# Patient Record
Sex: Male | Born: 1965 | Hispanic: No | Marital: Married | Smoking: Never smoker
Health system: Southern US, Community
[De-identification: ages and names within clinical notes are randomized; demographics above are authoritative.]

## PROBLEM LIST (undated history)

## (undated) DIAGNOSIS — T7840XA Allergy, unspecified, initial encounter: Secondary | ICD-10-CM

## (undated) DIAGNOSIS — L509 Urticaria, unspecified: Secondary | ICD-10-CM

## (undated) DIAGNOSIS — T783XXA Angioneurotic edema, initial encounter: Secondary | ICD-10-CM

## (undated) HISTORY — DX: Allergy, unspecified, initial encounter: T78.40XA

## (undated) HISTORY — DX: Urticaria, unspecified: L50.9

## (undated) HISTORY — DX: Angioneurotic edema, initial encounter: T78.3XXA

---

## 2000-09-10 ENCOUNTER — Ambulatory Visit (HOSPITAL_COMMUNITY): Admission: RE | Admit: 2000-09-10 | Discharge: 2000-09-10 | Payer: Self-pay | Admitting: Family Medicine

## 2000-09-10 ENCOUNTER — Encounter: Payer: Self-pay | Admitting: Family Medicine

## 2000-10-17 ENCOUNTER — Ambulatory Visit (HOSPITAL_COMMUNITY): Admission: RE | Admit: 2000-10-17 | Discharge: 2000-10-17 | Payer: Self-pay | Admitting: Family Medicine

## 2006-06-20 ENCOUNTER — Ambulatory Visit: Payer: Self-pay | Admitting: Family Medicine

## 2006-06-20 LAB — CONVERTED CEMR LAB
ALT: 78 units/L — ABNORMAL HIGH (ref 0–40)
AST: 49 units/L — ABNORMAL HIGH (ref 0–37)
Albumin: 4.3 g/dL (ref 3.5–5.2)
Alkaline Phosphatase: 72 units/L (ref 39–117)
Amylase: 60 units/L (ref 27–131)
Bilirubin, Direct: 0.1 mg/dL (ref 0.0–0.3)
H Pylori IgG: NEGATIVE
Lipase: 29 units/L (ref 11.0–59.0)
Total Bilirubin: 0.6 mg/dL (ref 0.3–1.2)
Total Protein: 7.1 g/dL (ref 6.0–8.3)

## 2006-07-01 ENCOUNTER — Ambulatory Visit: Payer: Self-pay | Admitting: Family Medicine

## 2006-07-01 LAB — CONVERTED CEMR LAB
Ceruloplasmin: 27 mg/dL (ref 21–63)
HCV Ab: NEGATIVE
Hep B S Ab: NEGATIVE
Hepatitis B Surface Ag: NEGATIVE
Iron: 71 ug/dL (ref 42–165)

## 2006-07-10 ENCOUNTER — Ambulatory Visit: Payer: Self-pay | Admitting: Gastroenterology

## 2006-07-19 ENCOUNTER — Ambulatory Visit: Payer: Self-pay | Admitting: Family Medicine

## 2006-08-22 ENCOUNTER — Ambulatory Visit: Payer: Self-pay | Admitting: Family Medicine

## 2006-08-22 LAB — CONVERTED CEMR LAB
ALT: 63 units/L — ABNORMAL HIGH (ref 0–40)
AST: 40 units/L — ABNORMAL HIGH (ref 0–37)

## 2006-10-02 ENCOUNTER — Ambulatory Visit: Payer: Self-pay | Admitting: Family Medicine

## 2006-10-13 LAB — CONVERTED CEMR LAB
ALT: 44 units/L — ABNORMAL HIGH (ref 0–40)
AST: 30 units/L (ref 0–37)

## 2006-10-15 ENCOUNTER — Telehealth (INDEPENDENT_AMBULATORY_CARE_PROVIDER_SITE_OTHER): Payer: Self-pay | Admitting: *Deleted

## 2006-10-16 DIAGNOSIS — K219 Gastro-esophageal reflux disease without esophagitis: Secondary | ICD-10-CM

## 2006-10-21 ENCOUNTER — Telehealth (INDEPENDENT_AMBULATORY_CARE_PROVIDER_SITE_OTHER): Payer: Self-pay | Admitting: *Deleted

## 2010-10-06 NOTE — Assessment & Plan Note (Signed)
[facility]                        [place] OFFICE NOTE   JAYMASON, LEDESMA                    MRN:          [id number]  DATE:06/20/2006                            DOB:          Oct 24, 1965    REASON FOR VISIT:  Abdominal pain.  Mr. Groesbeck is a 45 year old male  who reports a very long history of acid reflux.  About 3 years ago he  was evaluated by a gastroenterologist in [facility].  According to  the patient, he had an endoscopy, and he was told there was slight  redness in his gastric wall, but otherwise unremarkable.  He had been  put on Protonix, which he took for a year with no significant  improvement.  Subsequently, he was put on Nexium, which improved his  acid reflux into his throat, but did not calm down the abdominal  burning, and according to the patient, it caused dryness when he took  the Nexium daily.  Over the last year he has noticed increased acid  reflux associated with regurgitation of food.  The only thing that he  does not regurgitate according to the patient is lettuce or cucumber.  Food high in acidity are significant precipitants, i.e., lemons, tomato  based products, chocolate, and alcohol.  The wife was present and  revealed that Mr. Randazzo was a heavy alcohol consumer, and this  played a large role in his symptoms.  Additionally, she reports that  over the holidays he drank heavily, and she feels that is what  precipitated his symptoms most recently.  Mr. Krapf reports that he  deals with daily acid reflux.   REVIEW OF SYSTEMS:  He denies any weight loss, fever, chills.  He has  noted 10 to 12-pound weight gain over the last couple of months, but he  revealed that he had been eating heavily during the holidays.   With regards to his abdominal pain, he describes it as burning in the  epigastrium into the right upper quadrant.  It usually occurs after  eating, although it can occur during  eating.  Symptoms have been present  for a year.   PAST MEDICAL HISTORY:  Gastroesophageal reflux.   MEDICATIONS:  Nexium.   ALLERGIES:  NO KNOWN DRUG ALLERGIES.   FAMILY HISTORY:  Father passed away with a history of type 2 diabetes  and lung cancer, i.e., a heavy smoker.  Siblings are alive and well.   SOCIAL HISTORY:  Patient is a Education administrator.  Married with 3 children.  Denies  any tobacco use.  He states that he quit 10 years ago.  Regarding  alcohol, he is down to once a week, although it can be heavy at times.   IMPRESSION:  A 45 year old male with a history of gastroesophageal  reflux disease.  Based on history and physical, peptic ulcer disease is  high on the differential complicated by alcohol consumption.  Hepatobiliary etiology needs to be ascertained.   PLAN:  1. I had a long discussion with Dr. Jennet Maduro and his wife regarding      his alcohol consumption and the effects on his body, not  just with      the symptoms he is currently presenting with.  He expressed      understanding, and states that he is decreasing his alcohol level      currently.  2. We will start patient on Zegerid 40 mg each morning 30 minutes      before breakfast x4 weeks.  3. We will obtain the follow lab work:  H. pylori, LFT, amylase,      lipase.  4. We will refer patient for an abdominal ultrasound to assess the      gallbladder and liver.  5. Reviewed diet changes, i.e., decrease alcohol consumption, avoid      spicy foods, tomato based products, chocolate, and tobacco use.  6. Patient is to follow up with me in 1 month for reassessment,      obviously sooner if his symptoms worsen in any way.  Patient      expressed understanding and had no further questions.     Leanne Chang, M.D.  Electronically Signed    LA/MedQ  DD: 06/20/2006  DT: 06/20/2006  Job #: [id number]

## 2010-10-06 NOTE — Assessment & Plan Note (Signed)
[facility]                        [place] OFFICE NOTE   Bruce Weeks, Bruce Weeks                    MRN:          [id number]  DATE:07/19/2006                            DOB:          Oct 08, 1965    REASON FOR VISIT:  Followup on gastroesophageal reflux.   The patient presents today reporting that he feels much better after  starting the Nexium.  Specifically, he also noticed significant  improvement with changing his diet.  He also has not had any alcohol in  the last 4-6 weeks and his wife states that is probably the most likely  reason his symptoms have improved drastically.  He occasionally gets  some acid reflux but that is based on the types of foods he may eat.  The Nexium has caused no side effects.   Of note, the patient was recently seen at an Urgent Care and treated for  the flu.  Nonetheless, he was started on Cipro and a cough medicine.  He reports he feels much better.   MEDICATIONS:  1. Nexium.  2. Cipro 500 mg b.i.d. for 7 days.  3. Cough medicine.   ALLERGIES:  No drug allergies.   OBJECTIVE:  Weight 191, temperature 98.2, pulse 74, blood pressure  120/80.  GENERAL:  Pleasant male in no acute distress,  questioned appropriately.  LUNGS:  Clear.  HEART:  Regular rate and rhythm.  ABDOMEN:  Soft, nondistended, no palpable mass or hepatosplenomegaly, no  rebound or guarding.   Imaging study/ultrasound was performed on July 10, 2006, which  showed no acute findings.   LABORATORY DATA:  LFTs showed an ALT and AST of 49 and 78 on June 21, 2006.  He had subsequent labs that were performed, including iron,  hepatitis panel A, B and C, alpha-1 antitrypsin and ceruloplasmin, which  were all unremarkable.   IMPRESSION:  14. A 45 year old male with gastroesophageal reflux plus gastritis      secondary to diet and alcohol consumption, responded well to Nexium      and lifestyle changes.  2. Mildly elevated AST and  ALT, most likely secondary to what was      stated above.   PLAN:  1. Advised the patient to continue Nexium 40 mg daily for next 3-4      weeks, then he can switch over to over-the-counter Prilosec.  2. Reiterated the importance of alcohol cessation and diet changes.  3. The patient to follow up in 3 weeks as scheduled to repeat AST and      ALT.  The patient and wife expressed understanding.     Leanne Chang, M.D.  Electronically Signed    LA/MedQ  DD: 07/19/2006  DT: 07/19/2006  Job #: [id number]

## 2012-11-12 ENCOUNTER — Ambulatory Visit: Payer: Self-pay | Admitting: Emergency Medicine

## 2012-11-12 ENCOUNTER — Telehealth: Payer: Self-pay | Admitting: Radiology

## 2012-11-12 ENCOUNTER — Ambulatory Visit (HOSPITAL_COMMUNITY)
Admission: RE | Admit: 2012-11-12 | Discharge: 2012-11-12 | Disposition: A | Discharge status: Home / Self Care | Payer: Self-pay | Source: Ambulatory Visit | Attending: Emergency Medicine | Admitting: Emergency Medicine

## 2012-11-12 VITALS — BP 110/70 | HR 54 | Temp 97.7°F | Resp 18 | Ht 69.0 in | Wt 189.0 lb

## 2012-11-12 DIAGNOSIS — R252 Cramp and spasm: Secondary | ICD-10-CM

## 2012-11-12 DIAGNOSIS — R109 Unspecified abdominal pain: Secondary | ICD-10-CM

## 2012-11-12 DIAGNOSIS — M549 Dorsalgia, unspecified: Secondary | ICD-10-CM

## 2012-11-12 DIAGNOSIS — M479 Spondylosis, unspecified: Secondary | ICD-10-CM | POA: Insufficient documentation

## 2012-11-12 LAB — POCT URINALYSIS DIPSTICK
Bilirubin, UA: NEGATIVE
Blood, UA: NEGATIVE
Glucose, UA: NEGATIVE
Ketones, UA: NEGATIVE
Leukocytes, UA: NEGATIVE
Nitrite, UA: NEGATIVE
Protein, UA: NEGATIVE
Spec Grav, UA: 1.02
Urobilinogen, UA: 0.2
pH, UA: 7

## 2012-11-12 LAB — POCT CBC
Granulocyte percent: 51.4 %G (ref 37–80)
HCT, POC: 46.7 % (ref 43.5–53.7)
Hemoglobin: 15.3 g/dL (ref 14.1–18.1)
Lymph, poc: 1.8 (ref 0.6–3.4)
MCH, POC: 30.1 pg (ref 27–31.2)
MCHC: 32.8 g/dL (ref 31.8–35.4)
MCV: 91.9 fL (ref 80–97)
MID (cbc): 0.4 (ref 0–0.9)
MPV: 8 fL (ref 0–99.8)
POC Granulocyte: 2.3 (ref 2–6.9)
POC LYMPH PERCENT: 40.1 %L (ref 10–50)
POC MID %: 8.5 %M (ref 0–12)
Platelet Count, POC: 254 10*3/uL (ref 142–424)
RBC: 5.08 M/uL (ref 4.69–6.13)
RDW, POC: 13.7 %
WBC: 4.5 10*3/uL — AB (ref 4.6–10.2)

## 2012-11-12 LAB — POCT UA - MICROSCOPIC ONLY
Bacteria, U Microscopic: NEGATIVE
Casts, Ur, LPF, POC: NEGATIVE
Crystals, Ur, HPF, POC: NEGATIVE
Epithelial cells, urine per micros: NEGATIVE
Mucus, UA: NEGATIVE
Yeast, UA: NEGATIVE

## 2012-11-12 MED ORDER — DICYCLOMINE HCL 20 MG PO TABS: 20.0000 mg | ORAL_TABLET | Freq: Three times a day (TID) | ORAL | Status: DC

## 2012-11-12 MED ORDER — IOHEXOL 300 MG/ML  SOLN
80.0000 mL | Freq: Once | INTRAMUSCULAR | Status: AC | PRN
  Administered 2012-11-12: 80 mL via INTRAVENOUS

## 2012-11-12 NOTE — Patient Instructions (Signed)
Don't eat before you go to [facility] for your scan. You are to go to [facility] and go to the first floor which is Radiology. You will check in there.

## 2012-11-12 NOTE — Progress Notes (Signed)
  Subjective:    Patient ID: Duke Salvia, male    DOB: 1965/12/14, 47 y.o.   MRN: [id number]  HPI Pt complains of left sided lower abd pain. His left leg also hurts. He states it has been going on a while, but it is intermittent. Denies n/v/d. No fever. Doesn't hurt when he twists and turns. Says he feels an "itching" sensation on the inside. Normal bowel movements.     Review of Systems     Objective:   Physical Exam patient is alert and cooperative he does not appear in any distress. His chest is clear. Heart regular rate no murmurs. Abdomen is flat bowel sounds are normal there is significant tenderness left lower abdomen. Genitourinary exam reveals a small left testicle there are no hernias palpable he has some sebaceous material and the left side of the scrotal sac. Rectal exam shows a normal-sized prostate which is nontender  Results for orders placed in visit on 11/12/12  POCT CBC      Result Value Range   WBC 4.5 (*) 4.6 - 10.2 K/uL   Lymph, poc 1.8  0.6 - 3.4   POC LYMPH PERCENT 40.1  10 - 50 %L   MID (cbc) 0.4  0 - 0.9   POC MID % 8.5  0 - 12 %M   POC Granulocyte 2.3  2 - 6.9   Granulocyte percent 51.4  37 - 80 %G   RBC 5.08  4.69 - 6.13 M/uL   Hemoglobin 15.3  14.1 - 18.1 g/dL   HCT, POC 62.9  52.8 - 53.7 %   MCV 91.9  80 - 97 fL   MCH, POC 30.1  27 - 31.2 pg   MCHC 32.8  31.8 - 35.4 g/dL   RDW, POC 41.3     Platelet Count, POC 254  142 - 424 K/uL   MPV 8.0  0 - 99.8 fL  POCT URINALYSIS DIPSTICK      Result Value Range   Color, UA yellow     Clarity, UA clear     Glucose, UA neg     Bilirubin, UA neg     Ketones, UA neg     Spec Grav, UA 1.020     Blood, UA neg     pH, UA 7.0     Protein, UA neg     Urobilinogen, UA 0.2     Nitrite, UA neg     Leukocytes, UA Negative    POCT UA - MICROSCOPIC ONLY      Result Value Range   WBC, Ur, HPF, POC neg     RBC, urine, microscopic neg     Bacteria, U Microscopic neg     Mucus, UA neg     Epithelial cells,  urine per micros neg     Crystals, Ur, HPF, POC neg     Casts, Ur, LPF, POC neg     Yeast, UA neg          Assessment & Plan:  Patient with significant left lower abdominal pain with no fever and no elevation in white count. We'll proceed with CT abdomen and pelvis to evaluate. This did not disclose any abnormalities. We'll treat with Bentyl 20 mg 3 times a day to return to clinic if pain persists

## 2012-11-12 NOTE — Telephone Encounter (Signed)
Left message for patient to call me back about scan, Dr Cleta Alberts watns bentyl sent in also, this is done. If patient not improving he should return to clinic.

## 2012-11-13 NOTE — Telephone Encounter (Signed)
Pt calling back regarding his CT Scan that was done yesterday. [phone]

## 2012-11-17 ENCOUNTER — Telehealth: Payer: Self-pay

## 2012-11-17 NOTE — Telephone Encounter (Signed)
Patient advised.

## 2012-11-17 NOTE — Telephone Encounter (Signed)
Patient is calling about his results [phone]

## 2013-03-18 ENCOUNTER — Ambulatory Visit: Payer: No Typology Code available for payment source | Attending: Internal Medicine

## 2013-03-18 ENCOUNTER — Ambulatory Visit: Payer: Self-pay

## 2013-05-11 ENCOUNTER — Ambulatory Visit: Payer: No Typology Code available for payment source | Attending: Internal Medicine | Admitting: Internal Medicine

## 2013-05-11 ENCOUNTER — Encounter: Payer: Self-pay | Admitting: Internal Medicine

## 2013-05-11 VITALS — BP 146/85 | HR 90 | Temp 98.3°F | Resp 17 | Wt 191.6 lb

## 2013-05-11 DIAGNOSIS — J3489 Other specified disorders of nose and nasal sinuses: Secondary | ICD-10-CM

## 2013-05-11 DIAGNOSIS — Z139 Encounter for screening, unspecified: Secondary | ICD-10-CM

## 2013-05-11 DIAGNOSIS — R0981 Nasal congestion: Secondary | ICD-10-CM

## 2013-05-11 DIAGNOSIS — Z23 Encounter for immunization: Secondary | ICD-10-CM

## 2013-05-11 DIAGNOSIS — N529 Male erectile dysfunction, unspecified: Secondary | ICD-10-CM

## 2013-05-11 DIAGNOSIS — J020 Streptococcal pharyngitis: Secondary | ICD-10-CM

## 2013-05-11 DIAGNOSIS — K219 Gastro-esophageal reflux disease without esophagitis: Secondary | ICD-10-CM

## 2013-05-11 LAB — CBC WITH DIFFERENTIAL/PLATELET
Basophils Relative: 1 % (ref 0–1)
Eosinophils Absolute: 0.2 10*3/uL (ref 0.0–0.7)
HCT: 41.2 % (ref 39.0–52.0)
Hemoglobin: 14.8 g/dL (ref 13.0–17.0)
Lymphs Abs: 1.8 10*3/uL (ref 0.7–4.0)
MCH: 29.8 pg (ref 26.0–34.0)
MCHC: 35.9 g/dL (ref 30.0–36.0)
MCV: 82.9 fL (ref 78.0–100.0)
Monocytes Absolute: 0.7 10*3/uL (ref 0.1–1.0)
Monocytes Relative: 14 % — ABNORMAL HIGH (ref 3–12)
Neutro Abs: 2.7 10*3/uL (ref 1.7–7.7)
Neutrophils Relative %: 49 % (ref 43–77)
RBC: 4.97 MIL/uL (ref 4.22–5.81)

## 2013-05-11 LAB — COMPLETE METABOLIC PANEL WITH GFR
Albumin: 4.6 g/dL (ref 3.5–5.2)
BUN: 16 mg/dL (ref 6–23)
CO2: 24 mEq/L (ref 19–32)
Calcium: 8.9 mg/dL (ref 8.4–10.5)
Chloride: 103 mEq/L (ref 96–112)
GFR, Est African American: >89 mL/min
GFR, Est Non African American: >89 mL/min
Glucose, Bld: 102 mg/dL — ABNORMAL HIGH (ref 70–99)
Potassium: 4 mEq/L (ref 3.5–5.3)
Sodium: 139 mEq/L (ref 135–145)
Total Protein: 6.9 g/dL (ref 6.0–8.3)

## 2013-05-11 LAB — LIPID PANEL: LDL Cholesterol: 105 mg/dL — ABNORMAL HIGH (ref 0–99)

## 2013-05-11 MED ORDER — FLUTICASONE PROPIONATE 50 MCG/ACT NA SUSP: 2.0000 | Freq: Every day | NASAL | Status: DC

## 2013-05-11 MED ORDER — OMEPRAZOLE 20 MG PO CPDR: 20.0000 mg | DELAYED_RELEASE_CAPSULE | Freq: Every day | ORAL | Status: DC

## 2013-05-11 NOTE — Progress Notes (Signed)
Patient here to establish care Complain of sore throat since this am with a little pain

## 2013-05-11 NOTE — Progress Notes (Signed)
Patient Demographics  Bruce Weeks, is a 47 y.o. male  UXL:[id number]  ZDG:[id number]  DOB - 08/01/1965  CC:  Chief Complaint  Patient presents with  . Sore Throat       HPI: Bruce Weeks is a 47 y.o. male here today to establish medical care. He has his chest GERD, used to take Prilosec but has not been taking it, also reported to have some nasal congestion stuffy nose sore throat minimal cough denies any fever chills chest pain or shortness of breath, today his blood pressure is elevated denies any previous history of hypertension. Patient also reported to have erectile dysfunction denies any dysuria or any penile discharge. Patient has No headache, No chest pain, No abdominal pain - No Nausea, No new weakness tingling or numbness, minimal cough Cough - no SOB.  No Known Allergies History reviewed. No pertinent past medical history. Current Outpatient Prescriptions on File Prior to Visit  Medication Sig Dispense Refill  . dicyclomine (BENTYL) 20 MG tablet Take 1 tablet (20 mg total) by mouth every 8 (eight) hours.  40 tablet  0   No current facility-administered medications on file prior to visit.   Family History  Problem Relation Age of Onset  . Diabetes Mother   . Cancer Father   . Hypertension Maternal Grandmother   . Heart disease Maternal Grandmother    History   Social History  . Marital Status: Single    Spouse Name: N/A    Number of Children: N/A  . Years of Education: N/A   Occupational History  . Not on file.   Social History Main Topics  . Smoking status: Never Smoker   . Smokeless tobacco: Not on file  . Alcohol Use: Yes  . Drug Use: No  . Sexual Activity: Not on file   Other Topics Concern  . Not on file   Social History Narrative  . No narrative on file    Review of Systems: Constitutional: Negative for fever, chills, diaphoresis, activity change, appetite change and fatigue. HENT: Negative for ear pain, nosebleeds,, facial  swelling, rhinorrhea, neck pain, neck stiffness and ear discharge. + Nasal congestion sore throat Eyes: Negative for pain, discharge, redness, itching and visual disturbance. Respiratory: Negative for cough, choking, chest tightness, shortness of breath, wheezing and stridor.  Cardiovascular: Negative for chest pain, palpitations and leg swelling. Gastrointestinal: Negative for abdominal distention. Genitourinary: Negative for dysuria, urgency, frequency, hematuria, flank pain, decreased urine volume, difficulty urinating and dyspareunia.  Musculoskeletal: Negative for back pain, joint swelling, arthralgia and gait problem. Neurological: Negative for dizziness, tremors, seizures, syncope, facial asymmetry, speech difficulty, weakness, light-headedness, numbness and headaches.  Hematological: Negative for adenopathy. Does not bruise/bleed easily. Psychiatric/Behavioral: Negative for hallucinations, behavioral problems, confusion, dysphoric mood, decreased concentration and agitation.    Objective:   Filed Vitals:   05/11/13 1656  BP: 146/85  Pulse: 90  Temp: 98.3 F (36.8 C)  Resp: 17    Physical Exam: Constitutional: Patient appears well-developed and well-nourished. No distress. HENT: Normocephalic, atraumatic, External right and left ear normal. Oropharynx is clear and moist. Nasal congestion no sinus tenderness, minimal pharyngeal erythema no exudate  Eyes: Conjunctivae and EOM are normal. PERRLA, no scleral icterus. Neck: Normal ROM. Neck supple. No JVD. No tracheal deviation. No thyromegaly. CVS: RRR, S1/S2 +, no murmurs, no gallops, no carotid bruit.  Pulmonary: Effort and breath sounds normal, no stridor, rhonchi, wheezes, rales.  Abdominal: Soft. BS +, no distension, tenderness, rebound or guarding.  Musculoskeletal: Normal range of  motion. No edema and no tenderness.  Lymphadenopathy: No lymphadenopathy noted, cervical,  Neuro: Alert. Normal reflexes, muscle tone  coordination. No cranial nerve deficit. Skin: Skin is warm and dry. No rash noted. Not diaphoretic. No erythema. No pallor. Psychiatric: Normal mood and affect. Behavior, judgment, thought content normal.  Lab Results  Component Value Date   WBC 4.5* 11/12/2012   HGB 15.3 11/12/2012   HCT 46.7 11/12/2012   MCV 91.9 11/12/2012   No results found for this basename: CREATININE, BUN, NA, K, CL, CO2    No results found for this basename: HGBA1C   Lipid Panel  No results found for this basename: chol, trig, hdl, cholhdl, vldl, ldlcalc       Assessment and plan:   1. GERD (gastroesophageal reflux disease)  - omeprazole (PRILOSEC) 20 MG capsule; Take 1 capsule (20 mg total) by mouth daily.  Dispense: 30 capsule; Refill: 3  2. sore throat  - Rapid Strep A test is negative will send  Throat culture Loney Loh), advised patient for saltwater gargles  3. Stuffy nose  - fluticasone (FLONASE) 50 MCG/ACT nasal spray; Place 2 sprays into both nostrils daily.  Dispense: 16 g; Refill: 6  4. Erectile dysfunction  - Ambulatory referral to Urology  5. Screening - CBC with Differential - COMPLETE METABOLIC PANEL WITH GFR - TSH - Lipid panel - Vit D  25 hydroxy (rtn osteoporosis monitoring)   Health Maintenance Flu shot today   Return in about 6 weeks (around 06/22/2013).       Doris Cheadle, MD

## 2013-05-11 NOTE — Patient Instructions (Signed)
Dieta reducida en sodio, 2 gramos de sodio (2 Gram Low Sodium Diet) Esta dieta restringe la cantidad de sodio a no ms de 2 gramos o 2000 miligramos (mg) por da. La cantidad de sodio se limita para ayudar a disminuir la presin arterial y es importante para la insuficiencia cardaca y los problemas renales y hepticos. Muchos alimentos contienen sodio para mejorar el sabor y algunas veces como conservantes. Por lo tanto, cuando es necesario disminuir la cantidad de sodio en la dieta, es importante saber que productos hay que buscar cuando se eligen alimentos y bebidas. Puede demorar algn tiempo acostumbrarse a realizar cambios dietarios. Aqu se incluye informacin y una gua que lo ayudarn a adaptarse a una dieta reducida en sodio.  CONSEJOS PRCTICOS  No le agregue sal a los alimentos.  Evite los comidas de preparacin rpida que generalmente son elevadas en sodio.  Elija colaciones sin sal.  Compre productos con bajo contenido de sodio, en los que en su etiqueta diga: "bajo contenido de sodio" o "sin agregado de sal".  Verifique las etiquetas de los alimentos para conocer cunto sodio hay en una porcin.  Cuando coma en un restaurante, pida que preparen su comida con menos sal, y sin nada de sal en lo posible. LECTURA DE LAS ETIQUETAS DE LOS ALIMENTOS PARA INFORMARSE ACERCA DEL SODIO QUE CONTIENEN. La seccin Informacin Nutricional es un buen lugar en el que hallar cunta cantidad de sodio contienen los alimentos. Busque productos que no contengan ms de 500-600 mg/comida y no ms de 150 mg. por porcin Recuerde que 2 g = 2000 mg. La etiqueta de los alimentos tambin puede informar de ste modo:  Sin contenido de sodio: Menos de cinco mg. por porcin.  Muy bajo contenido de sodio: 35 mg o menos por porcin.  Bajo contenido de sodio: 140 mg o menos por porcin.  Bajo en sodio: 50% menos de sodio por porcin. Por ejemplo, si un alimento generalmente contiene 300 mg de sodio se  modifica para ser considerado bajo en sodio, tendr 150 mg de sodio.  Reducido en sodio: 25% menos de sodio por porcin. Por ejemplo, si un alimento que generalmente tiene 400 mg de sodio se modifica para ser reducido en sodio, tendr 300 mg de sodio. ELECCIN DE LOS ALIMENTOS Granos  Evite: Galletitas y colaciones saladas. Algunos cereales, incluyendo cereales calientes. Panificados y mezclas para bizcochos. Arroz condimentado o mezclas para pastas.  Elija: Colaciones sin sal. Cereales con bajo contenido de sodio, avena, arroz y trigo inflado, cereal para el desayuno. Pan y bollitos tipo Ingls. Pastas. Carnes  Evite: Saladas, en lata, ahumadas, condimentadas, en escabeche incluyendo pescado y pollo. Tocino, jamn, salchichas, fiambres, panchos, anchoas.  Elija: Atn y salmn enlatado con bajo contenido de sodio. Carne, pollo o pescado frescos o congelados. Lcteos  Evite: Quesos procesados y cremosos. Queso cottage. Manteca y leche condensada. Quesos comunes.  Elija: Leche. Queso cottage con bajo contenido de sodio. Yogur. Crema. Quesos con bajo contenido de sodio. Frutas y Vegetales  Evite: Vegetales comunes en lata. Salsa de tomate y pastas en lata. Vegetales en salsas congelados. Aceitunas. Pickles. Salsas. Chucrut.  Elija: Vegetales en lata con bajo contenido de sodio. Salsa de tomate y pastas con bajo contenido de sodio. Vegetales frescos o congelados. Frutas frescas y congeladas. Condimentos  Evite: Salsas en lata y envasadas. Salsa Worcestershire. Salsa trtara. Salsa Barbecue. Salsa de soja. Salsa de carne. Ketchup. Sal de cebolla, de ajo y sal de mesa. Saborizantes y tiernizantes para carne.  Elija:   Hierbas y especias frescas y secas. Variedades de mostaza y ketchup con bajo contenido de sodio. Jugo de limn. Salsa tabasco. Rbanos picantes. EJEMPLO DE PLAN ALIMENTARIO CON 2 GRAMOS DE SODIO Desayuno / Sodio (mg)  1 taza de leche descremada / 143 mg  2 rebanadas de pan  integral tostado / 270 mg  1 cucharada de margarina en tubo saludable para el corazn / 153 mg  1 huevo duro / 139 mg  1 naranja pequea / 0 mg Almuerzo / Sodio (mg)  1 taza de zanahorias crudas / 76 mg   taza de garbanzos / 298 mg  1 taza de leche descremada / 143 mg   taza de uvas negras / 2 mg  1 pan de pita de salvado / 356 mg Cena / Sodio (mg)  1 taza de pasta de salvado / 2 mg  1 taza de jugo de tomates bajo en sodio / 73 mg  3 onzas de carne picada magra / 57 mg  1 ensalada pequea (1 taza de espicanas crudas,  taza de pepinos,  taza de pimientos amarillos) con 1 cucharada de aceite de oliva y 1 cucharada de vinagre de vino / 25 mg Colacin / Sodio (mg)  1 pote de yogur de vainilla descremado / 107 mg  3 crackers de graham / 127 mg Anlisis nutricional  Caloras: 2033  Protenas: 77 g  Hidratos de carbono: 282 g  Grasa: 72 g  Sodio: 1.971 mg Document Released: 10/29/2005 Document Revised: 07/30/2011 [employer] Patient Information 2014 [employer].  

## 2013-05-12 LAB — TSH: TSH: 0.942 u[IU]/mL (ref 0.350–4.500)

## 2013-05-12 LAB — VITAMIN D 25 HYDROXY (VIT D DEFICIENCY, FRACTURES): Vit D, 25-Hydroxy: 19 ng/mL — ABNORMAL LOW (ref 30–89)

## 2013-05-13 ENCOUNTER — Telehealth: Payer: Self-pay

## 2013-05-13 MED ORDER — VITAMIN D (ERGOCALCIFEROL) 1.25 MG (50000 UNIT) PO CAPS: 50000.0000 [IU] | ORAL_CAPSULE | ORAL | Status: DC

## 2013-05-13 NOTE — Telephone Encounter (Signed)
Used interpreter line Patient is aware of his lab results  [facility]

## 2013-05-13 NOTE — Telephone Encounter (Signed)
Message copied by Lestine Mount on Wed May 13, 2013  9:57 AM ------      Message from: Doris Cheadle      Created: Tue May 12, 2013 10:08 AM       Blood work reviewed, noticed low vitamin D, call patient advise to start ergocalciferol 50,000 units once a week for the duration of  12 weeks.      Also  noticed elevated triglycerides , advise patient for low fat diet.       ------

## 2013-06-22 ENCOUNTER — Encounter: Payer: Self-pay | Admitting: Internal Medicine

## 2013-06-22 ENCOUNTER — Ambulatory Visit: Payer: No Typology Code available for payment source | Attending: Internal Medicine | Admitting: Internal Medicine

## 2013-06-22 VITALS — BP 142/82 | HR 62 | Temp 98.6°F | Resp 14 | Ht 68.0 in | Wt 194.0 lb

## 2013-06-22 DIAGNOSIS — E559 Vitamin D deficiency, unspecified: Secondary | ICD-10-CM | POA: Insufficient documentation

## 2013-06-22 DIAGNOSIS — K219 Gastro-esophageal reflux disease without esophagitis: Secondary | ICD-10-CM

## 2013-06-22 DIAGNOSIS — L738 Other specified follicular disorders: Secondary | ICD-10-CM | POA: Insufficient documentation

## 2013-06-22 DIAGNOSIS — L678 Other hair color and hair shaft abnormalities: Secondary | ICD-10-CM | POA: Insufficient documentation

## 2013-06-22 MED ORDER — BACITRACIN-NEOMYCIN-POLYMYXIN 400-5-5000 EX OINT: 1.0000 "application " | TOPICAL_OINTMENT | Freq: Two times a day (BID) | CUTANEOUS | Status: DC

## 2013-06-22 MED ORDER — OMEPRAZOLE 20 MG PO CPDR: 40.0000 mg | DELAYED_RELEASE_CAPSULE | Freq: Every day | ORAL | Status: DC

## 2013-06-22 MED ORDER — VITAMIN D (ERGOCALCIFEROL) 1.25 MG (50000 UNIT) PO CAPS: 50000.0000 [IU] | ORAL_CAPSULE | ORAL | Status: DC

## 2013-06-22 NOTE — Progress Notes (Signed)
Patient ID: Bruce Weeks, male   DOB: 06/27/1965, 48 y.o.   MRN: [id number]   CC:  HPI: 48 year old male presents with a follicle on his right breast. Recently he started picking on it and has noticed that it has become more itchy, no surrounding redness or inflammation His leg pain is better after starting vitamin D however he did not follow the instructions and took vitamin D 50,000 units for 3 consecutive days, now requesting a refill  History of gastroesophageal reflux disease and had an endoscopy 6 years ago   No Known Allergies No past medical history on file. Current Outpatient Prescriptions on File Prior to Visit  Medication Sig Dispense Refill  . omeprazole (PRILOSEC) 20 MG capsule Take 1 capsule (20 mg total) by mouth daily.  30 capsule  3  . Vitamin D, Ergocalciferol, (DRISDOL) 50000 UNITS CAPS capsule Take 1 capsule (50,000 Units total) by mouth every 7 (seven) days.  12 capsule  0  . dicyclomine (BENTYL) 20 MG tablet Take 1 tablet (20 mg total) by mouth every 8 (eight) hours.  40 tablet  0  . fluticasone (FLONASE) 50 MCG/ACT nasal spray Place 2 sprays into both nostrils daily.  16 g  6   No current facility-administered medications on file prior to visit.   Family History  Problem Relation Age of Onset  . Diabetes Mother   . Cancer Father   . Hypertension Maternal Grandmother   . Heart disease Maternal Grandmother    History   Social History  . Marital Status: Single    Spouse Name: N/A    Number of Children: N/A  . Years of Education: N/A   Occupational History  . Not on file.   Social History Main Topics  . Smoking status: Never Smoker   . Smokeless tobacco: Not on file  . Alcohol Use: Yes  . Drug Use: No  . Sexual Activity: Not on file   Other Topics Concern  . Not on file   Social History Narrative  . No narrative on file    Review of Systems  Constitutional: As in history of present illness HENT: Negative for ear pain, nosebleeds,  congestion, facial swelling, rhinorrhea, neck pain, neck stiffness and ear discharge.   Eyes: Negative for pain, discharge, redness, itching and visual disturbance.  Respiratory: Negative for cough, choking, chest tightness, shortness of breath, wheezing and stridor.   Cardiovascular: Negative for chest pain, palpitations and leg swelling.  Gastrointestinal: Negative for abdominal distention.  Genitourinary: Negative for dysuria, urgency, frequency, hematuria, flank pain, decreased urine volume, difficulty urinating and dyspareunia.  Musculoskeletal: Negative for back pain, joint swelling, arthralgias and gait problem.  Neurological: Negative for dizziness, tremors, seizures, syncope, facial asymmetry, speech difficulty, weakness, light-headedness, numbness and headaches.  Hematological: Negative for adenopathy. Does not bruise/bleed easily.  Psychiatric/Behavioral: Negative for hallucinations, behavioral problems, confusion, dysphoric mood, decreased concentration and agitation.    Objective:   Filed Vitals:   06/22/13 1704  BP: 142/82  Pulse: 62  Temp: 98.6 F (37 C)  Resp: 14    Physical Exam  Constitutional: Appears well-developed and well-nourished. No distress.  HENT: Normocephalic. External right and left ear normal. Oropharynx is clear and moist.  Eyes: Conjunctivae and EOM are normal. PERRLA, no scleral icterus.  Neck: Normal ROM. Neck supple. No JVD. No tracheal deviation. No thyromegaly.  CVS: RRR, S1/S2 +, no murmurs, no gallops, no carotid bruit.  Pulmonary: Effort and breath sounds normal, no stridor, rhonchi, wheezes, rales.  Abdominal: Soft.  BS +,  no distension, tenderness, rebound or guarding.  Musculoskeletal: Normal range of motion. No edema and no tenderness.  Lymphadenopathy: No lymphadenopathy noted, cervical, inguinal. Neuro: Alert. Normal reflexes, muscle tone coordination. No cranial nerve deficit. Skin: Skin is warm and dry. No rash noted. Not diaphoretic.  No erythema. Follicle in the right breast  Psychiatric: Normal mood and affect. Behavior, judgment, thought content normal.   Lab Results  Component Value Date   WBC 5.4 05/11/2013   HGB 14.8 05/11/2013   HCT 41.2 05/11/2013   MCV 82.9 05/11/2013   PLT 238 05/11/2013   Lab Results  Component Value Date   CREATININE 0.82 05/11/2013   BUN 16 05/11/2013   NA 139 05/11/2013   K 4.0 05/11/2013   CL 103 05/11/2013   CO2 24 05/11/2013    No results found for this basename: HGBA1C   Lipid Panel     Component Value Date/Time   CHOL 186 05/11/2013 1747   TRIG 189* 05/11/2013 1747   HDL 43 05/11/2013 1747   CHOLHDL 4.3 05/11/2013 1747   VLDL 38 05/11/2013 1747   LDLCALC 105* 05/11/2013 1747       Assessment and plan:   Patient Active Problem List   Diagnosis Date Noted  . GERD 10/16/2006    Gastroesophageal reflux Endoscopy about 7 years ago Patient continues to drink alcohol Therefore we'll refer for repeat EGD  Folliculitis Prescribed triple antibiotic ointment Advised the patient to stop picking on it to stop the irritation X.   Vitamin D deficiency Advised to hold vitamin D for 2 more weeks and then resume  Followup in 4 months, will need repeat labs for vitamin D, renal function    The patient was given clear instructions to go to ER or return to medical center if symptoms don't improve, worsen or new problems develop. The patient verbalized understanding. The patient was told to call to get any lab results if not heard anything in the next week.

## 2013-06-22 NOTE — Progress Notes (Signed)
Pt is here for f/u for GERD. Needs a refill on the Vitamin D. Complains of tenderness, possible cyst on the Rt side of outer breast near armpit x3 years. Felt like a pimple on the breast. Has squeezed the cyst x3 months and creates pain. Pain scale of 3 today only form touch. Pt has an interpreter.

## 2013-08-31 ENCOUNTER — Encounter (HOSPITAL_COMMUNITY): Payer: Self-pay | Admitting: Emergency Medicine

## 2013-08-31 ENCOUNTER — Emergency Department (HOSPITAL_COMMUNITY)
Admission: EM | Admit: 2013-08-31 | Discharge: 2013-08-31 | Disposition: A | Discharge status: Home / Self Care | Payer: No Typology Code available for payment source | Source: Home / Self Care | Attending: Family Medicine | Admitting: Family Medicine

## 2013-08-31 DIAGNOSIS — R059 Cough, unspecified: Secondary | ICD-10-CM

## 2013-08-31 DIAGNOSIS — J309 Allergic rhinitis, unspecified: Secondary | ICD-10-CM

## 2013-08-31 DIAGNOSIS — R05 Cough: Secondary | ICD-10-CM

## 2013-08-31 MED ORDER — BENZONATATE 100 MG PO CAPS: 100.0000 mg | ORAL_CAPSULE | Freq: Three times a day (TID) | ORAL | Status: DC | PRN

## 2013-08-31 MED ORDER — CETIRIZINE HCL 10 MG PO TABS: 10.0000 mg | ORAL_TABLET | Freq: Every day | ORAL | Status: DC

## 2013-08-31 MED ORDER — IPRATROPIUM BROMIDE 0.06 % NA SOLN: 2.0000 | Freq: Four times a day (QID) | NASAL | Status: DC

## 2013-08-31 NOTE — ED Notes (Signed)
Cough x past few days , causing ST, HA

## 2013-08-31 NOTE — ED Provider Notes (Signed)
CSN: [id number]     Arrival date & time 08/31/13  1938 History   First MD Initiated Contact with Patient 08/31/13 2104     Chief Complaint  Patient presents with  . Cough   (Consider location/radiation/quality/duration/timing/severity/associated sxs/prior Treatment) Patient is a 48 y.o. male presenting with cough. The history is provided by the patient, the spouse and a relative. The history is limited by a language barrier. A language interpreter was used.  Cough Cough characteristics:  Non-productive Severity:  Mild Onset quality:  Gradual Duration:  3 days Timing:  Intermittent Chronicity:  New Smoker: no   Context: exposure to allergens and weather changes   Associated symptoms: ear fullness, rhinorrhea, sinus congestion and sore throat   Associated symptoms: no chest pain, no chills, no diaphoresis, no ear pain, no eye discharge, no fever, no headaches, no myalgias, no rash, no shortness of breath, no weight loss and no wheezing   Associated symptoms comment:  +post nasal drainage   History reviewed. No pertinent past medical history. History reviewed. No pertinent past surgical history. Family History  Problem Relation Age of Onset  . Diabetes Mother   . Cancer Father   . Hypertension Maternal Grandmother   . Heart disease Maternal Grandmother    History  Substance Use Topics  . Smoking status: Never Smoker   . Smokeless tobacco: Not on file  . Alcohol Use: Yes    Review of Systems  Constitutional: Negative for fever, chills, weight loss and diaphoresis.  HENT: Positive for rhinorrhea and sore throat. Negative for ear pain.   Eyes: Negative for discharge.  Respiratory: Positive for cough. Negative for shortness of breath and wheezing.   Cardiovascular: Negative for chest pain.  Musculoskeletal: Negative for myalgias.  Skin: Negative for rash.  Neurological: Negative for headaches.  All other systems reviewed and are negative.   Allergies  Review of patient's  allergies indicates no known allergies.  Home Medications   Current Outpatient Rx  Name  Route  Sig  Dispense  Refill  . benzonatate (TESSALON) 100 MG capsule   Oral   Take 1 capsule (100 mg total) by mouth 3 (three) times daily as needed for cough.   21 capsule   0   . cetirizine (ZYRTEC) 10 MG tablet   Oral   Take 1 tablet (10 mg total) by mouth daily.   30 tablet   1   . dicyclomine (BENTYL) 20 MG tablet   Oral   Take 1 tablet (20 mg total) by mouth every 8 (eight) hours.   40 tablet   0   . fluticasone (FLONASE) 50 MCG/ACT nasal spray   Each Nare   Place 2 sprays into both nostrils daily.   16 g   6   . ipratropium (ATROVENT) 0.06 % nasal spray   Each Nare   Place 2 sprays into both nostrils 4 (four) times daily.   15 mL   1   . neomycin-bacitracin-polymyxin (NEOSPORIN) ointment   Topical   Apply 1 application topically every 12 (twelve) hours. Apply to folliculitis on the right breast   30 g   0   . omeprazole (PRILOSEC) 20 MG capsule   Oral   Take 2 capsules (40 mg total) by mouth daily.   60 capsule   3   . Vitamin D, Ergocalciferol, (DRISDOL) 50000 UNITS CAPS capsule   Oral   Take 1 capsule (50,000 Units total) by mouth every 7 (seven) days.   6 capsule  2    BP 144/66  Pulse 88  Temp(Src) 98.3 F (36.8 C) (Oral)  Resp 16 Physical Exam  Nursing note and vitals reviewed. Constitutional: He is oriented to person, place, and time. He appears well-developed and well-nourished. No distress.  HENT:  Head: Normocephalic and atraumatic.  Right Ear: Hearing, tympanic membrane, external ear and ear canal normal.  Left Ear: Hearing, tympanic membrane, external ear and ear canal normal.  Nose: Mucosal edema and rhinorrhea present.  Mouth/Throat: Uvula is midline, oropharynx is clear and moist and mucous membranes are normal.  +clear post nasal drainage  Eyes: Conjunctivae are normal. Right eye exhibits no discharge. Left eye exhibits no discharge. No  scleral icterus.  Neck: Normal range of motion. Neck supple.  Cardiovascular: Normal rate, regular rhythm and normal heart sounds.   Pulmonary/Chest: Effort normal and breath sounds normal.  Musculoskeletal: Normal range of motion.  Lymphadenopathy:    He has no cervical adenopathy.  Neurological: He is alert and oriented to person, place, and time.  Skin: Skin is warm and dry. No rash noted.  Psychiatric: He has a normal mood and affect. His behavior is normal.    ED Course  Procedures (including critical care time) Labs Review Labs Reviewed - No data to display Imaging Review No results found.   MDM   1. Allergic rhinitis   2. Cough   Will treat with Atrovent, Zyrtec and tessalon and advise PCP follow up if symptoms do not improve.    Jess BartersJennifer Lee DundalkPresson, GeorgiaPA 08/31/13 2303

## 2013-08-31 NOTE — Discharge Instructions (Signed)
Rinitis alrgica (Allergic Rhinitis) La rinitis alrgica ocurre cuando las membranas mucosas de la nariz responden a los alrgenos. Los alrgenos son las partculas que estn en el aire y que hacen que el cuerpo tenga una reaccin IT consultant. Esto hace que usted libere anticuerpos alrgicos. A travs de una cadena de eventos, estos finalmente hacen que usted libere histamina en la corriente sangunea. Aunque la funcin de la histamina es proteger al organismo, es esta liberacin de histamina lo que provoca malestar, como los estornudos frecuentes, la congestin y goteo y Engineer, petroleum.  CAUSAS  La causa de la rinitis Regulatory affairs officer (fiebre del heno) son los alrgenos del polen que pueden provenir del csped, los rboles y Human resources officer. La causa de la rinitis Stage manager (rinitis alrgica perenne) son los alrgenos como los caros del polvo domstico, la caspa de las mascotas y las esporas del moho.  SNTOMAS   Secrecin nasal (congestin).  Goteo y picazn nasales con estornudos y Industrial/product designer. DIAGNSTICO  Su mdico puede ayudarlo a Actor alrgeno o los alrgenos que desencadenan sus sntomas. Si usted y su mdico no pueden Teacher, adult education cul es el alrgeno, pueden hacerse anlisis de sangre o estudios de la piel. TRATAMIENTO  La rinitis alrgica no tiene [place], pero puede controlarse mediante lo siguiente:  Medicamentos y vacunas contra la alergia (inmunoterapia).  Prevencin del alrgeno. La fiebre del heno a menudo puede tratarse con antihistamnicos en las formas de pldoras o aerosol nasal. Los antihistamnicos bloquean los efectos de la histamina. Existen medicamentos de venta libre que pueden ayudar con la congestin nasal y la hinchazn alrededor de los ojos. Consulte a su mdico antes de tomar o administrarse este medicamento.  Si la prevencin del alrgeno o el medicamento recetado no dan resultado, existen muchos medicamentos nuevos que su mdico puede recetarle. Pueden  usarse medicamentos ms fuertes si las medidas iniciales no son efectivas. Pueden aplicarse inyecciones desensibilizantes si los medicamentos y la prevencin no funcionan. La desensibilizacin ocurre cuando un paciente recibe vacunas constantes hasta que el cuerpo se vuelve menos sensible al alrgeno. Asegrese de Chartered certified accountant seguimiento con su mdico si los problemas continan. INSTRUCCIONES PARA EL CUIDADO EN EL HOGAR No es posible evitar por completo los alrgenos, pero puede reducir los sntomas al tomar medidas para limitar su exposicin a ellos. Es muy til saber exactamente a qu es alrgico para que pueda evitar sus desencadenantes especficos. SOLICITE ATENCIN MDICA SI:   Bruce Weeks.  Desarrolla una tos que no se detiene fcilmente (persistente).  Le falta el aire.  Comienza a tener sibilancias.  Los sntomas interfieren con las actividades diarias normales. Document Released: 02/14/2005 Document Revised: 02/25/2013 [facility] Patient Information 2014 [place], [place].  Tos - Adultos  (Cough, Adult)  La tos es un reflejo que ayuda a limpiar las vas areas y Patent examiner. Puede ayudar a curar el organismo o ser Ardelia Mems reaccin a un irritante. La tos puede durar [employer] 2  3 semanas (aguda) o puede durar ms de 8 semanas (crnica)  CAUSAS  Tos aguda:   Infecciones virales o bacterianas. Tos crnica.   Infecciones.  Alergias.  Asma.  Goteo post nasal.  El hbito de fumar.  Acidez o reflujo gstrico.  Algunos medicamentos.  Problemas pulmonares crnicos  Cncer. SNTOMAS   Tos.  Cristy Hilts.  Dolor en el pecho.  Aumento en el ritmo respiratorio.  Ruidos agudos al respirar (sibilancias).  Moco coloreado al toser (esputo). TRATAMIENTO   Un tos de causa bacteriana puede tratarse con antibiticos.  La  tos de origen viral debe seguir su curso y no responde a los antibióticos. °· El médico podrá recomendar otros tratamientos si tiene tos crónica. °INSTRUCCIONES PARA  EL CUIDADO EN EL HOGAR  °· Solo tome medicamentos que se pueden comprar sin receta o recetados para el dolor, malestar o fiebre, como le indica el médico. Utilice antitusivos sólo en la forma indicada por el médico. °· Use un vaporizador o humidificador de niebla fría en la habitación para ayudar a aflojar las secreciones. °· Duerma en posición semi erguida si la tos empeora por la noche. °· Descanse todo lo que pueda. °· Si fuma, abandone el hábito. °SOLICITE ATENCIÓN MÉDICA DE INMEDIATO SI:  °· Observa pus en el esputo. °· La tos empeora. °· No puede controlar la tos con antitusivos y no puede dormir debido a ello. °· Comienza a escupir sangre al toser. °· Tiene dificultad para respirar. °· El dolor empeora o no puede controlarlo con los medicamentos. °· Tiene fiebre. °ASEGÚRESE DE QUE:  °· Comprende estas instrucciones. °· Controlará su enfermedad. °· Solicitará ayuda de inmediato si no mejora o si empeora. °Document Released: 12/13/2010 Document Revised: 07/30/2011 °ExitCare® Patient Information ©2014 [employer]. ° °

## 2013-09-01 NOTE — ED Provider Notes (Signed)
Medical screening examination/treatment/procedure(s) were performed by resident physician or non-physician practitioner and as supervising physician I was immediately available for consultation/collaboration.   KINDL,JAMES DOUGLAS MD.   James D Kindl, MD 09/01/13 1117 

## 2013-09-04 ENCOUNTER — Ambulatory Visit: Payer: Self-pay | Admitting: Family Medicine

## 2013-09-04 VITALS — BP 118/72 | HR 77 | Temp 98.8°F | Resp 16 | Ht 69.0 in | Wt 191.8 lb

## 2013-09-04 DIAGNOSIS — J209 Acute bronchitis, unspecified: Secondary | ICD-10-CM

## 2013-09-04 MED ORDER — HYDROCODONE-HOMATROPINE 5-1.5 MG/5ML PO SYRP: 5.0000 mL | ORAL_SOLUTION | Freq: Three times a day (TID) | ORAL | Status: DC | PRN

## 2013-09-04 MED ORDER — AZITHROMYCIN 250 MG PO TABS: ORAL_TABLET | ORAL | Status: DC

## 2013-09-04 NOTE — Progress Notes (Addendum)
He reports x1 week of constant, gradually worsened cough, fever, congestion and HA. He was seen in the ED for this problem x4 days ago  and rx w/ atrovent, zyrtec and tessalon; was told to f/u w/his PCP is sx did not improve. He states he cannot tell that his medications have been improving his sx which is why he came here today. He reports same sx as when he was seen at the ED which have since worsened.   Objective: No acute distress, mildly diaphoretic, cooperative and alert HEENT: Unremarkable except for mild erythema in the throat without exudates Neck: Supple no adenopathy Chest: Few rhonchi, no rales Heart: Regular no murmur  Assessment: Bronchitis  Acute bronchitis - Plan: HYDROcodone-homatropine (HYCODAN) 5-1.5 MG/5ML syrup, azithromycin (ZITHROMAX Z-PAK) 250 MG tablet, DISCONTINUED: azithromycin (ZITHROMAX Z-PAK) 250 MG tablet  Signed, Elvina SidleKurt Jenica Costilow, MD

## 2013-09-04 NOTE — Patient Instructions (Signed)
Bronquitis  (Bronchitis)  La bronquitis es una inflamación de las vías respiratorias que se extienden desde la tráquea hasta los pulmones (bronquios). A menudo, la inflamación produce la formación de mucosidad, lo que genera tos. Si la inflamación es grave, puede provocar falta de aire.  CAUSAS   Las causas de la bronquitis pueden ser:   · Infecciones virales.  · Bacterias.  · Humo del cigarrillo.  · Alérgenos, contaminantes y otros irritantes.  SIGNOS Y SÍNTOMAS   El síntoma más habitual de la bronquitis es la tos frecuente con mucosidad. Otros síntomas son:  · Fiebre.  · Dolores en el cuerpo.  · Congestión en el pecho.  · Escalofríos.  · Falta de aire.  · Dolor de garganta.  DIAGNÓSTICO   La bronquitis en general se diagnostica con la historia clínica y un examen físico. En algunos casos se indican otros estudios, como radiografías, para descartar otras enfermedades.   TRATAMIENTO   Tal vez deba evitar el contacto con la causa del problema (por ejemplo, el cigarrillo). En algunos casos es necesario administrar medicamentos. Estos pueden ser:  · Antibióticos. Tal vez se los receten si la causa de la bronquitis es una bacteria.  · Antitusivos. Tal vez se los receten para aliviar los síntomas de la tos.  · Medicamentos inhalados. Tal vez se los receten para liberar las vías respiratorias y facilitar la respiración.  · Medicamentos con corticoides. Tal vez se los receten si tiene bronquitis recurrente (crónica).  INSTRUCCIONES PARA EL CUIDADO EN EL HOGAR  · Descanse lo suficiente.  · Beba líquidos en abundancia para mantener la orina de color claro o amarillo pálido (excepto que padezca una enfermedad que requiera la restricción de líquidos). Tome mucho líquido para disolver las secreciones y evitar la deshidratación.  · Tome solo medicamentos de venta libre o recetados, según las indicaciones del médico.  · Tome los antibióticos exclusivamente según las indicaciones. Finalice la prescripción completa, aunque se  sienta mejor.  · Evite el humo de segunda mano, los químicos irritantes y los vapores fuertes. Estos agentes empeoran la bronquitis. Si es fumador, abandone el hábito. Considere el uso de goma de mascar o la aplicación de parches en la piel que contengan nicotina para aliviar los síntomas de abstinencia. Si deja de fumar, sus pulmones se curarán más rápido.  · Ponga un humidificador de vapor frío en la habitación por la noche para humedecer el aire. Puede ayudarlo a aflojar la mucosidad. Cambie el agua del humidificador a diario. También puede abrir el agua caliente de la ducha y sentarse en el baño con la puerta cerrada durante 5 a 10 minutos.  · Concurra a las consultas de control con su médico según las indicaciones.  · Lávese las manos con frecuencia para evitar contagiarse bronquitis nuevamente y para no extender la infección a otras personas.  SOLICITE ATENCIÓN MÉDICA SI:  Los síntomas no mejoran después de 1 semana de tratamiento.   SOLICITE ATENCIÓN MÉDICA DE INMEDIATO SI:  · La fiebre aumenta.  · Tiene escalofríos.  · Siente dolor en el pecho.  · Le empeora la falta el aire.  · La flema tiene sangre.  · Se desmaya.  · Tiene vahídos.  · Sufre un dolor intenso de cabeza.  · Vomita repetidas veces.  ASEGÚRESE DE QUE:   · Comprende estas instrucciones.  · Controlará su afección.  · Recibirá ayuda de inmediato si no mejora o si empeora.  Document Released: 05/07/2005 Document Revised: 02/25/2013  ExitCare® Patient Information ©2014 [employer].

## 2013-09-08 ENCOUNTER — Encounter: Payer: Self-pay | Admitting: Internal Medicine

## 2013-10-01 IMAGING — CT CT ABD-PELV W/ CM
2 of 5 series · 14 of 32 positions shown, 19 images · IV contrast (water/omni  & 80ml omni 300)
Comparison: 07/10/2006 abdominal ultrasound

CLINICAL DATA: Left abdominal pain

CT ABDOMEN AND PELVIS WITH CONTRAST
TECHNIQUE: Multidetector CT imaging of the abdomen and pelvis was
performed following the standard protocol during bolus
administration of intravenous contrast.
Contrast: 80mL OMNIPAQUE IOHEXOL 300 MG/ML  SOLN

[Series 2: routine abdomen · axial · 0.77mm/px · z∈[-490,-160]mm · 7 of 90 slices shown, 12 images]
[im 12/90  soft-tissue]
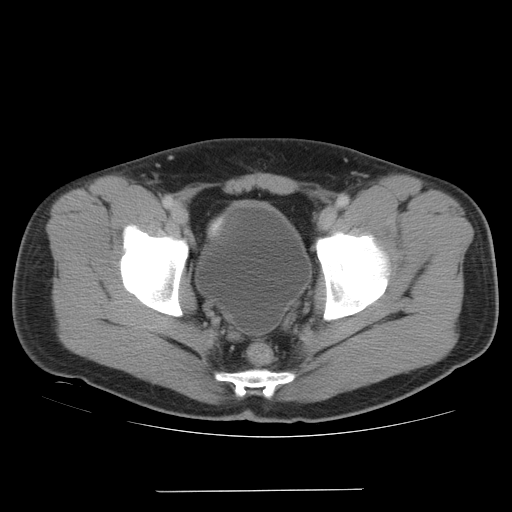
[im 12/90  bone]
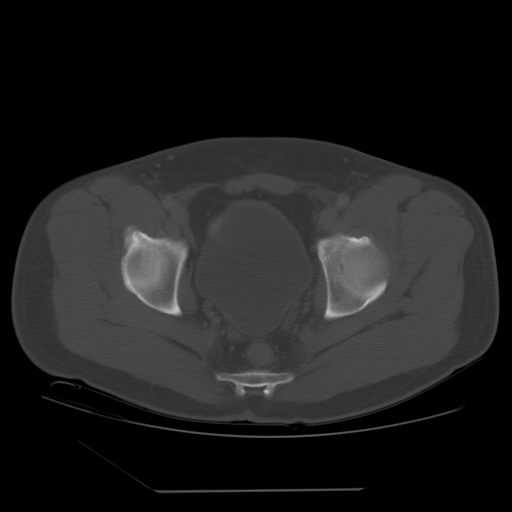
[im 23/90  soft-tissue]
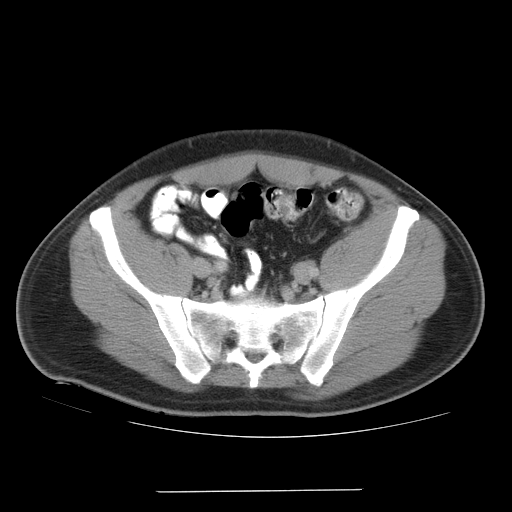
[im 34/90  soft-tissue]
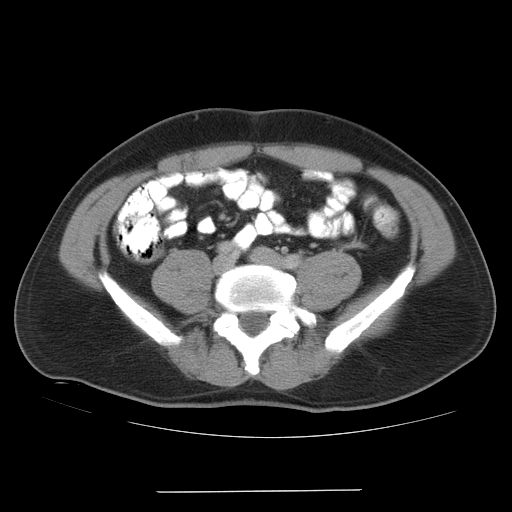
[im 45/90  soft-tissue]
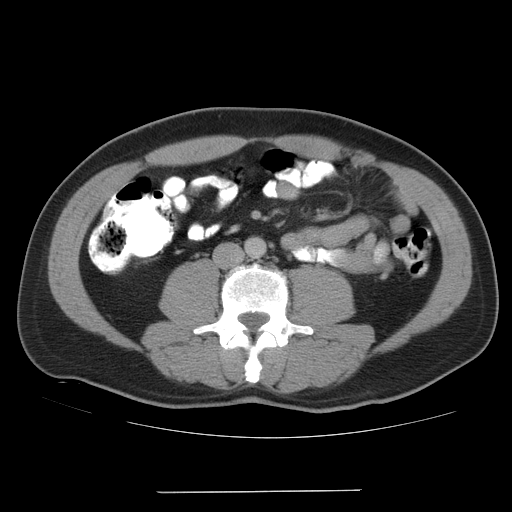
[im 45/90  lung]
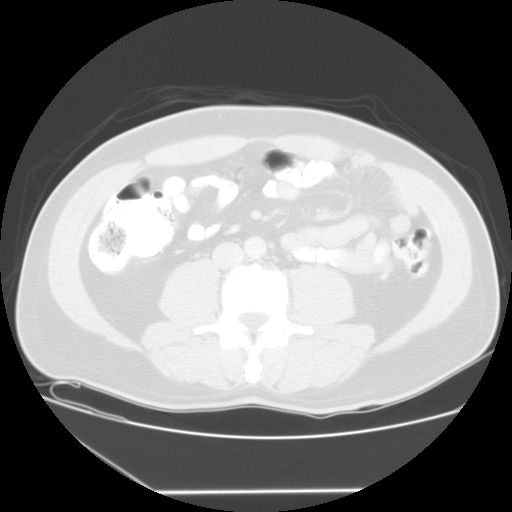
[im 56/90  soft-tissue]
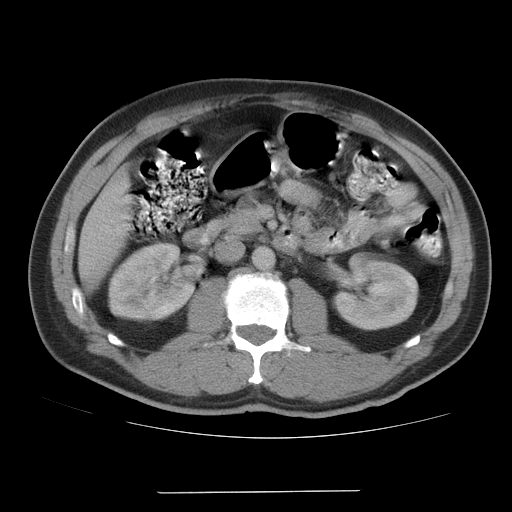
[im 56/90  lung]
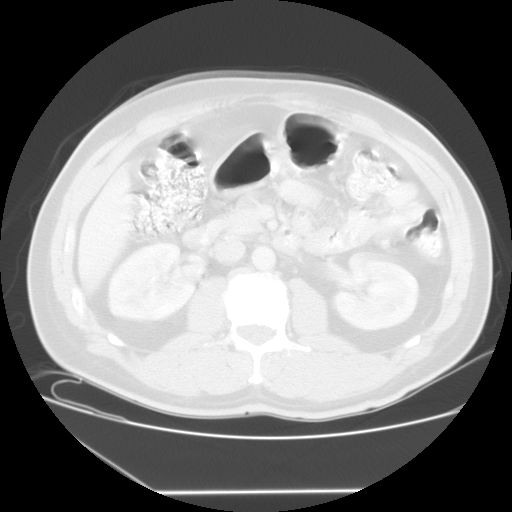
[im 67/90  soft-tissue]
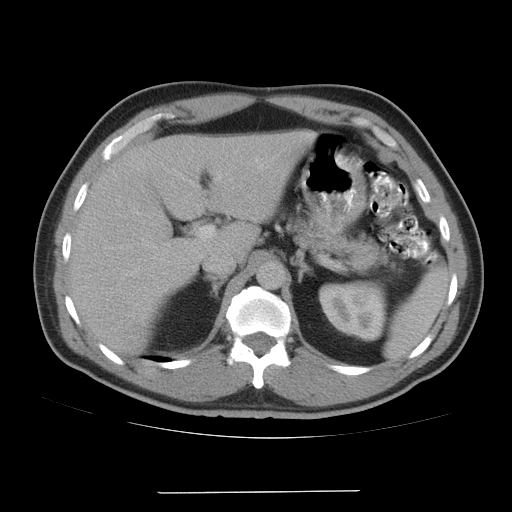
[im 67/90  lung]
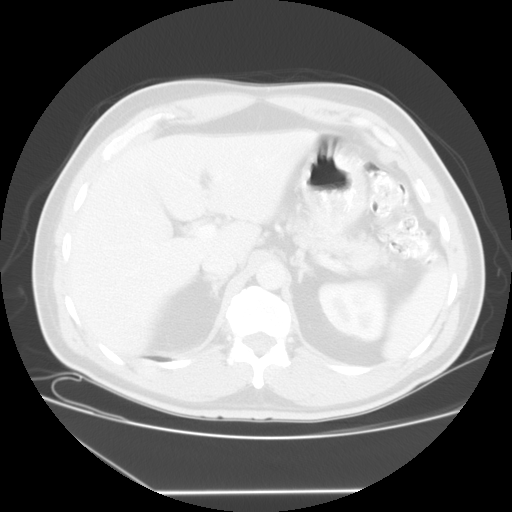
[im 78/90  soft-tissue]
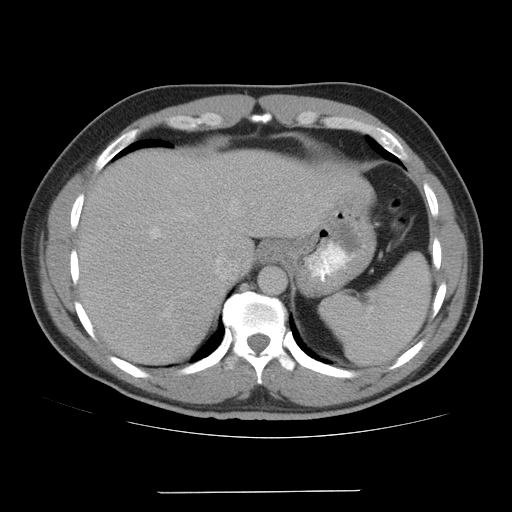
[im 78/90  lung]
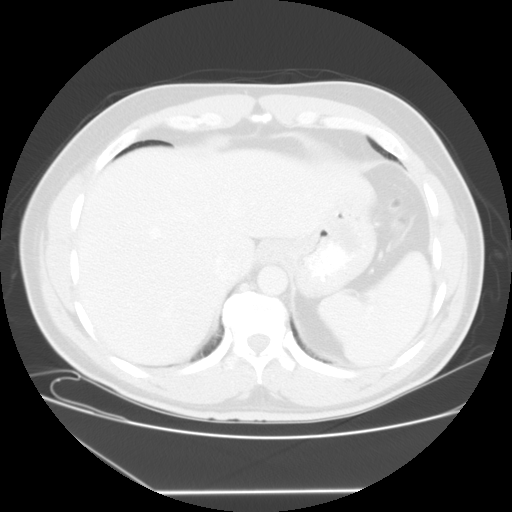

[Series 401: sagittals · sagittal · 0.91mm/px · 7 of 117 slices shown]
[im 13/117  soft-tissue]
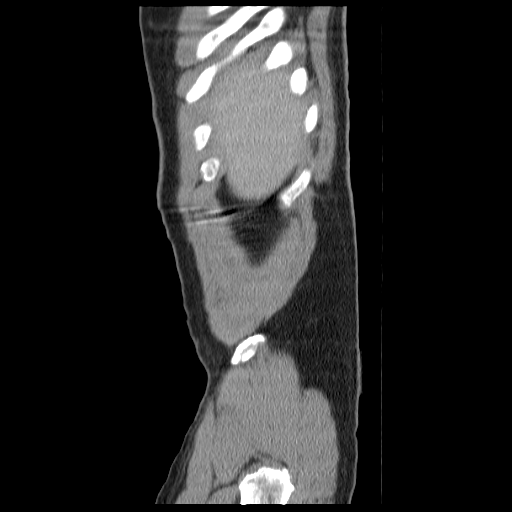
[im 26/117  soft-tissue]
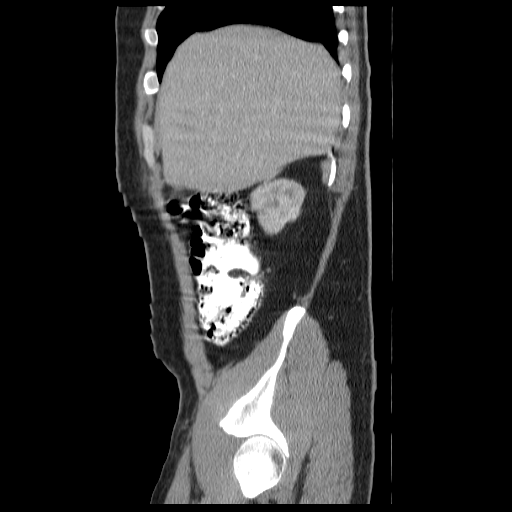
[im 39/117  soft-tissue]
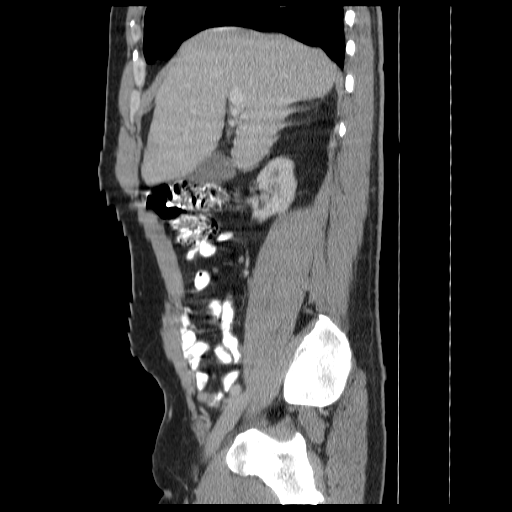
[im 52/117  soft-tissue]
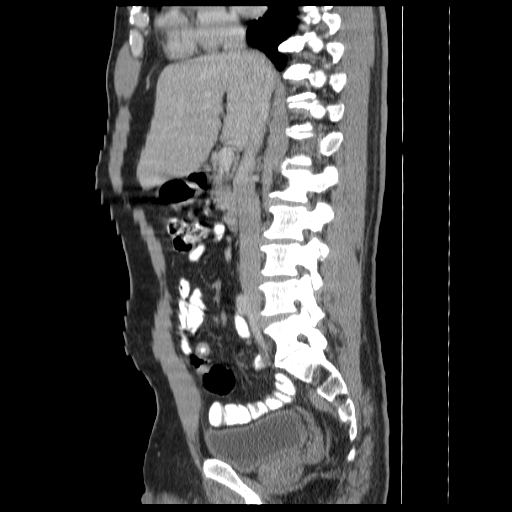
[im 65/117  soft-tissue]
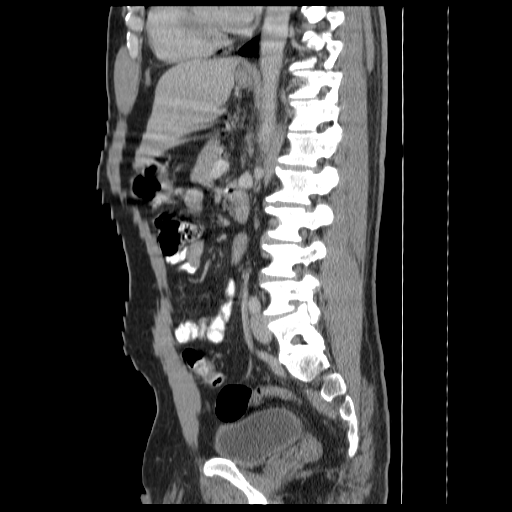
[im 78/117  soft-tissue]
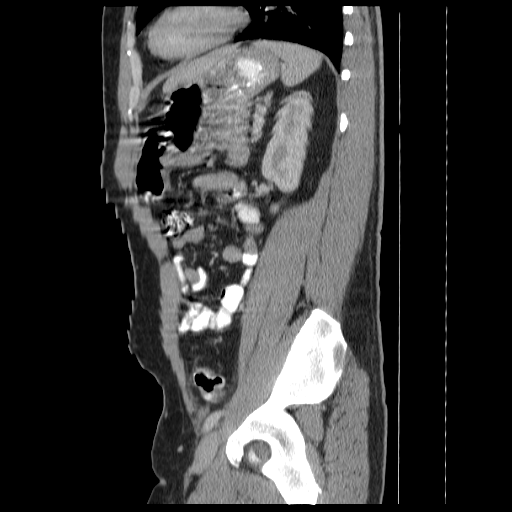
[im 91/117  soft-tissue]
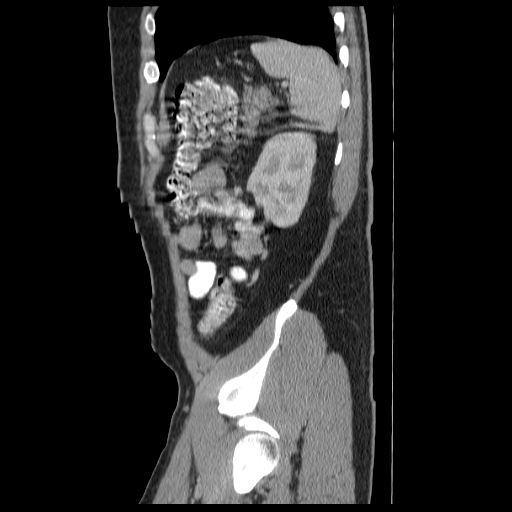

[14 of 32 positions shown; findings below may reference images not displayed]

FINDINGS: Minimal anterior right base atelectasis versus scarring.
Lung bases otherwise clear.  No lower lobe pneumonia appreciated.
Normal heart size.  No pericardial or pleural effusion.  No hiatal
hernia.

Abdomen:  Mild degradation related to motion artifact.  No focal
hepatic abnormality.  No biliary dilatation.  Portal vein is
patent.  The gallbladder, biliary system, pancreas, spleen, adrenal
glands, and kidneys are within normal limits for age and
demonstrate no acute process.

Negative for bowel obstruction, dilatation, ileus, or free air.
Normal appendix demonstrated.

No abdominal free fluid, fluid collection, hemorrhage, abscess, or
adenopathy.

Pelvis:  No pelvic free fluid, fluid collection, hemorrhage,
adenopathy, abscess, inguinal abnormality, or hernia.  Urinary
bladder unremarkable.  No acute distal bowel process.

Minor endplate degenerative changes of the spine diffusely.
IMPRESSION: Limited with respiratory motion artifact.

No definite acute intra-abdominal or pelvic process.

## 2013-10-20 ENCOUNTER — Ambulatory Visit: Payer: Self-pay | Admitting: Internal Medicine

## 2017-09-23 ENCOUNTER — Encounter: Payer: Self-pay | Admitting: Allergy and Immunology

## 2017-09-23 ENCOUNTER — Ambulatory Visit: Payer: Self-pay | Admitting: Allergy and Immunology

## 2017-09-23 VITALS — BP 118/80 | HR 60 | Temp 97.9°F | Resp 16 | Ht 67.64 in | Wt 186.6 lb

## 2017-09-23 DIAGNOSIS — T7840XD Allergy, unspecified, subsequent encounter: Secondary | ICD-10-CM

## 2017-09-23 DIAGNOSIS — L500 Allergic urticaria: Secondary | ICD-10-CM | POA: Insufficient documentation

## 2017-09-23 DIAGNOSIS — T7840XA Allergy, unspecified, initial encounter: Secondary | ICD-10-CM | POA: Insufficient documentation

## 2017-09-23 DIAGNOSIS — T783XXD Angioneurotic edema, subsequent encounter: Secondary | ICD-10-CM

## 2017-09-23 DIAGNOSIS — T783XXA Angioneurotic edema, initial encounter: Secondary | ICD-10-CM | POA: Insufficient documentation

## 2017-09-23 LAB — PULMONARY FUNCTION TEST

## 2017-09-23 MED ORDER — EPINEPHRINE 0.3 MG/0.3ML IJ SOAJ
0.3000 mg | Freq: Once | INTRAMUSCULAR | 2 refills | Status: AC | PRN
Start: 2017-09-23 — End: ?

## 2017-09-23 NOTE — Progress Notes (Signed)
New Patient Note  RE: Bruce Weeks MRN: [id number] DOB: March 04, 1966 Date of Office Visit: 09/23/2017  Referring provider: Sandre Kitty PA-C Primary care provider: Jonny Ruiz, MD  Chief Complaint: Allergic Reaction; Urticaria; and Angioedema   History of present illness: Bruce Weeks is a 52 y.o. male seen today in consultation requested by Barry Brunner, PA-C.  Approximately 2 years ago, the patient developed generalized hives, angioedema, the sensation of throat swelling, and difficulty breathing.  No specific medication, food, skin care product, detergent, soap, or other environmental triggers have been identified.  Since that time, he has had episodes with similar symptom constellation on 5 different occasions.  Most recently was 2 weeks ago when he woke up at 3 AM with large hives on his buttocks and back, facial swelling, the sensation of throat swelling, and difficulty breathing.  The symptoms resolved over the course of the next 3 days.  He has been seen in the emergency department for this problem and prescribed epinephrine autoinjector, however he has never use the epinephrine autoinjectors.  Several hours prior to the onset of symptoms 2 weeks ago he had taken naproxen before going to bed, however he does not recall having taken NSAIDs prior to the other episodes.  He consumes red meat and has a history of tick bite.  Assessment and plan: Allergic reaction The patients history suggests allergic reaction with an unclear trigger. Food allergen skin tests were negative today despite a positive histamine control. The negative predictive value for skin tests is excellent (greater than 95%). We will proceed with in vitro lab studies to help establish an etiology.  The following labs have been ordered: FCeRI antibody, anti-thyroglobulin antibody, thyroid peroxidase antibody, baseline serum tryptase, C4, CBC, CMP, and serum specific IgE against peanut, soybean, tree nut  panel, and galactose-alpha-1,3-galactose panel.   Should symptoms recur, a journal is to be kept recording any foods eaten, beverages consumed, medications taken within a 6 hour period prior to the onset of symptoms, as well as activities performed, and environmental conditions. For any symptoms concerning for anaphylaxis, epinephrine is to be administered and 911 is to be called immediately.  A refill prescription has been provided for epinephrine autoinjector 2 pack along with instructions for its proper administration.   Meds ordered this encounter  Medications  . EPINEPHrine 0.3 mg/0.3 mL IJ SOAJ injection    Sig: Inject 0.3 mLs (0.3 mg total) into the muscle once as needed for up to 1 dose.    Dispense:  1 Device    Refill:  2    Diagnostics: Food allergen skin testing: Negative despite a positive histamine control.    Physical examination: Blood pressure 118/80, pulse 60, temperature 97.9 F (36.6 C), temperature source Oral, resp. rate 16, height 5' 7.64" (1.718 m), weight 186 lb 9.6 oz (84.6 kg), SpO2 96 %.  General: Alert, interactive, in no acute distress. Neck: Supple without lymphadenopathy. Lungs: Clear to auscultation without wheezing, rhonchi or rales. CV: Normal S1, S2 without murmurs. Abdomen: Nondistended, nontender. Skin: Warm and dry, without lesions or rashes. Extremities:  No clubbing, cyanosis or edema. Neuro:   Grossly intact.  Review of systems:  Review of systems negative except as noted in HPI / PMHx or noted below: Review of Systems  Constitutional: Negative.   HENT: Negative.   Eyes: Negative.   Respiratory: Negative.   Cardiovascular: Negative.   Gastrointestinal: Negative.   Genitourinary: Negative.   Musculoskeletal: Negative.   Skin: Negative.   Neurological: Negative.  Endo/Heme/Allergies: Negative.   Psychiatric/Behavioral: Negative.     Past medical history:  Past Medical History:  Diagnosis Date  . Allergy   . Angio-edema   .  Urticaria     Past surgical history:  History reviewed. No pertinent surgical history.  Family history: Family History  Problem Relation Age of Onset  . Diabetes Mother   . Cancer Father   . Hypertension Maternal Grandmother   . Heart disease Maternal Grandmother     Social history: Social History   Socioeconomic History  . Marital status: Married    Spouse name: Not on file  . Number of children: Not on file  . Years of education: Not on file  . Highest education level: Not on file  Occupational History  . Not on file  Social Needs  . Financial resource strain: Not on file  . Food insecurity:    Worry: Not on file    Inability: Not on file  . Transportation needs:    Medical: Not on file    Non-medical: Not on file  Tobacco Use  . Smoking status: Never Smoker  . Smokeless tobacco: Never Used  Substance and Sexual Activity  . Alcohol use: Yes    Comment: occassionally  . Drug use: No  . Sexual activity: Not on file  Lifestyle  . Physical activity:    Days per week: Not on file    Minutes per session: Not on file  . Stress: Not on file  Relationships  . Social connections:    Talks on phone: Not on file    Gets together: Not on file    Attends religious service: Not on file    Active member of club or organization: Not on file    Attends meetings of clubs or organizations: Not on file    Relationship status: Not on file  . Intimate partner violence:    Fear of current or ex partner: Not on file    Emotionally abused: Not on file    Physically abused: Not on file    Forced sexual activity: Not on file  Other Topics Concern  . Not on file  Social History Narrative  . Not on file   Environmental History: The patient lives in an 50 year old house.  With hardwood floors throughout there is central air and heat.  He is a non-smoker without pets.  Allergies as of 09/23/2017   No Known Allergies     Medication List        Accurate as of 09/23/17  6:40 PM.  Always use your most recent med list.          cetirizine 10 MG tablet Commonly known as:  ZYRTEC Take 1 tablet (10 mg total) by mouth daily.   EPINEPHrine 0.3 mg/0.3 mL Soaj injection Commonly known as:  EPI-PEN Inject 0.3 mLs (0.3 mg total) into the muscle once as needed for up to 1 dose.       Known medication allergies: No Known Allergies  I appreciate the opportunity to take part in Traveon's care. Please do not hesitate to contact me with questions.  Sincerely,   R. Jorene Guest, MD

## 2017-09-23 NOTE — Assessment & Plan Note (Signed)
The patients history suggests allergic reaction with an unclear trigger. Food allergen skin tests were negative today despite a positive histamine control. The negative predictive value for skin tests is excellent (greater than 95%). We will proceed with in vitro lab studies to help establish an etiology.  The following labs have been ordered: FCeRI antibody, anti-thyroglobulin antibody, thyroid peroxidase antibody, baseline serum tryptase, C4, CBC, CMP, and serum specific IgE against peanut, soybean, tree nut panel, and galactose-alpha-1,3-galactose panel.   Should symptoms recur, a journal is to be kept recording any foods eaten, beverages consumed, medications taken within a 6 hour period prior to the onset of symptoms, as well as activities performed, and environmental conditions. For any symptoms concerning for anaphylaxis, epinephrine is to be administered and 911 is to be called immediately.  A refill prescription has been provided for epinephrine autoinjector 2 pack along with instructions for its proper administration.

## 2017-09-23 NOTE — Patient Instructions (Addendum)
Allergic reaction The patients history suggests allergic reaction with an unclear trigger. Food allergen skin tests were negative today despite a positive histamine control. The negative predictive value for skin tests is excellent (greater than 95%). We will proceed with in vitro lab studies to help establish an etiology.  The following labs have been ordered: FCeRI antibody, anti-thyroglobulin antibody, thyroid peroxidase antibody, baseline serum tryptase, C4, CBC, CMP, and serum specific IgE against peanut, soybean, tree nut panel, and galactose-alpha-1,3-galactose panel.   Should symptoms recur, a journal is to be kept recording any foods eaten, beverages consumed, medications taken within a 6 hour period prior to the onset of symptoms, as well as activities performed, and environmental conditions. For any symptoms concerning for anaphylaxis, epinephrine is to be administered and 911 is to be called immediately.  A refill prescription has been provided for epinephrine autoinjector 2 pack along with instructions for its proper administration.   When lab results have returned the patient will be called with further recommendations and follow up instructions.

## 2017-10-03 LAB — COMPREHENSIVE METABOLIC PANEL
ALT: 19 IU/L (ref 0–44)
AST: 18 IU/L (ref 0–40)
Albumin/Globulin Ratio: 2 (ref 1.2–2.2)
Albumin: 4.6 g/dL (ref 3.5–5.5)
Alkaline Phosphatase: 86 IU/L (ref 39–117)
BUN/Creatinine Ratio: 25 — ABNORMAL HIGH (ref 9–20)
BUN: 18 mg/dL (ref 6–24)
Bilirubin Total: 0.5 mg/dL (ref 0.0–1.2)
CO2: 20 mmol/L (ref 20–29)
Calcium: 9.1 mg/dL (ref 8.7–10.2)
Chloride: 104 mmol/L (ref 96–106)
Creatinine, Ser: 0.73 mg/dL — ABNORMAL LOW (ref 0.76–1.27)
GFR calc Af Amer: 126 mL/min/{1.73_m2} (ref >59)
GFR calc non Af Amer: 109 mL/min/{1.73_m2} (ref >59)
Globulin, Total: 2.3 g/dL (ref 1.5–4.5)
Glucose: 100 mg/dL — ABNORMAL HIGH (ref 65–99)
Potassium: 4.5 mmol/L (ref 3.5–5.2)
Sodium: 140 mmol/L (ref 134–144)
Total Protein: 6.9 g/dL (ref 6.0–8.5)

## 2017-10-03 LAB — ALPHA-GAL PANEL
Alpha Gal IgE*: <0.1 kU/L (ref <0.10)
Beef (Bos spp) IgE: <0.1 kU/L (ref <0.35)
Class Interpretation: 0
Class Interpretation: 0
Class Interpretation: 0
Lamb/Mutton (Ovis spp) IgE: <0.1 kU/L (ref <0.35)
Pork (Sus spp) IgE: <0.1 kU/L (ref <0.35)

## 2017-10-03 LAB — TRYPTASE: Tryptase: 1.9 ug/L — ABNORMAL LOW (ref 2.2–13.2)

## 2017-10-03 LAB — ALLERGENS(7)
Brazil Nut IgE: <0.1 kU/L
F020-IgE Almond: <0.1 kU/L
F202-IgE Cashew Nut: <0.1 kU/L
Hazelnut (Filbert) IgE: 2.1 kU/L — AB
Peanut IgE: 0.16 kU/L — AB
Pecan Nut IgE: <0.1 kU/L
Walnut IgE: <0.1 kU/L

## 2017-10-03 LAB — CBC WITH DIFFERENTIAL/PLATELET
Basophils Absolute: 0 10*3/uL (ref 0.0–0.2)
Basos: 1 %
EOS (ABSOLUTE): 0.2 10*3/uL (ref 0.0–0.4)
Eos: 4 %
Hematocrit: 46 % (ref 37.5–51.0)
Hemoglobin: 15.6 g/dL (ref 13.0–17.7)
Immature Grans (Abs): 0 10*3/uL (ref 0.0–0.1)
Immature Granulocytes: 1 %
Lymphocytes Absolute: 1.6 10*3/uL (ref 0.7–3.1)
Lymphs: 38 %
MCH: 29.3 pg (ref 26.6–33.0)
MCHC: 33.9 g/dL (ref 31.5–35.7)
MCV: 87 fL (ref 79–97)
Monocytes Absolute: 0.3 10*3/uL (ref 0.1–0.9)
Monocytes: 6 %
Neutrophils Absolute: 2.1 10*3/uL (ref 1.4–7.0)
Neutrophils: 50 %
Platelets: 240 10*3/uL (ref 150–379)
RBC: 5.32 x10E6/uL (ref 4.14–5.80)
RDW: 14.4 % (ref 12.3–15.4)
WBC: 4.1 10*3/uL (ref 3.4–10.8)

## 2017-10-03 LAB — CHRONIC URTICARIA: cu index: 1.6 (ref <10)

## 2017-10-03 LAB — C4 COMPLEMENT: Complement C4, Serum: 20 mg/dL (ref 14–44)

## 2017-10-03 LAB — ALLERGEN SOYBEAN: Soybean IgE: <0.1 kU/L

## 2017-10-03 LAB — THYROGLOBULIN ANTIBODY: Thyroglobulin Antibody: <1 IU/mL (ref 0.0–0.9)

## 2017-10-03 LAB — THYROID PEROXIDASE ANTIBODY: Thyroperoxidase Ab SerPl-aCnc: 11 IU/mL (ref 0–34)

## 2021-10-15 ENCOUNTER — Emergency Department (HOSPITAL_BASED_OUTPATIENT_CLINIC_OR_DEPARTMENT_OTHER)
Admission: EM | Admit: 2021-10-15 | Discharge: 2021-10-15 | Disposition: A | Discharge status: Home / Self Care | Payer: 59 | Attending: Emergency Medicine | Admitting: Emergency Medicine

## 2021-10-15 ENCOUNTER — Encounter (HOSPITAL_BASED_OUTPATIENT_CLINIC_OR_DEPARTMENT_OTHER): Payer: Self-pay | Admitting: Emergency Medicine

## 2021-10-15 ENCOUNTER — Other Ambulatory Visit: Payer: Self-pay

## 2021-10-15 DIAGNOSIS — M5442 Lumbago with sciatica, left side: Secondary | ICD-10-CM | POA: Diagnosis not present

## 2021-10-15 DIAGNOSIS — M545 Low back pain, unspecified: Secondary | ICD-10-CM | POA: Diagnosis present

## 2021-10-15 MED ORDER — ACETAMINOPHEN 500 MG PO TABS: 500.0000 mg | ORAL_TABLET | Freq: Four times a day (QID) | ORAL | 0 refills | Status: DC | PRN

## 2021-10-15 MED ORDER — METHYLPREDNISOLONE 4 MG PO TBPK: ORAL_TABLET | ORAL | 0 refills | Status: DC

## 2021-10-15 MED ORDER — METHOCARBAMOL 500 MG PO TABS: 500.0000 mg | ORAL_TABLET | Freq: Two times a day (BID) | ORAL | 0 refills | Status: AC

## 2021-10-15 NOTE — ED Triage Notes (Signed)
Pt c/o lower back w/ radiation to RLE; back has been hurting for a few weeks, but worse today, having difficulty moving; pt does painting for work

## 2021-10-15 NOTE — ED Notes (Addendum)
Pt states he has had back pain for several weeks. 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

## 2021-10-15 NOTE — ED Provider Notes (Signed)
[facility] EMERGENCY DEPARTMENT Provider Note   CSN: [id number] Arrival date & time: 10/15/21  1932     History  Chief Complaint  Patient presents with   Back Pain    Bruce Weeks is a 56 y.o. male who presents to the emergency department complaining of left lower back pain for several weeks.  Patient states he has had similar symptoms in the past, and got a steroid injection.  He states the pain began gradually and has been significantly worsening for the past 4 days.  No trauma to the area.  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 He describes the pain as shooting down his left leg into his left knee with associated intermittent numbness.  No history of cancer or IV drug use.  No fever.  He also notes several allergic reactions to different medications.  He has previously seen an allergist about this, and has an EpiPen.   Family member used as Equities trader during interview.    Back Pain Associated symptoms: numbness   Associated symptoms: no dysuria, no fever and no weakness       Home Medications Prior to Admission medications   Medication Sig Start Date End Date Taking? Authorizing Provider  acetaminophen (TYLENOL) 500 MG tablet Take 1 tablet (500 mg total) by mouth every 6 (six) hours as needed. 10/15/21  Yes Cigi Bega T, PA-C  cetirizine (ZYRTEC) 10 MG tablet Take 1 tablet (10 mg total) by mouth daily. 08/31/13   Presson, Mathis Fare, PA  EPINEPHrine 0.3 mg/0.3 mL IJ SOAJ injection Inject 0.3 mLs (0.3 mg total) into the muscle once as needed for up to 1 dose. 09/23/17   Bobbitt, Heywood Iles, MD  methocarbamol (ROBAXIN) 500 MG tablet Take 1 tablet (500 mg total) by mouth 2 (two) times daily. 10/15/21  Yes Tekla Malachowski T, PA-C  methylPREDNISolone (MEDROL DOSEPAK) 4 MG TBPK tablet Take per package instructions 10/15/21  Yes Blease Capaldi T, PA-C      Allergies    Patient has no known allergies.    Review of  Systems   Review of Systems  Constitutional:  Negative for fever.  Genitourinary:  Negative for difficulty urinating, dysuria and flank pain.  Musculoskeletal:  Positive for back pain. Negative for joint swelling.  Neurological:  Positive for numbness. Negative for weakness.  All other systems reviewed and are negative.  Physical Exam Updated Vital Signs BP 136/76 (BP Location: Right Arm)   Pulse 62   Temp 98 F (36.7 C) (Oral)   Resp 18   Ht 5\' 8"  (1.727 m)   Wt 81.6 kg   SpO2 100%   BMI 27.37 kg/m  Physical Exam Vitals and nursing note reviewed.  Constitutional:      Appearance: Normal appearance.  HENT:     Head: Normocephalic and atraumatic.  Eyes:     Conjunctiva/sclera: Conjunctivae normal.  Cardiovascular:     Rate and Rhythm: Normal rate and regular rhythm.  Pulmonary:     Effort: Pulmonary effort is normal. No respiratory distress.     Breath sounds: Normal breath sounds.  Abdominal:     General: There is no distension.     Palpations: Abdomen is soft.     Tenderness: There is no abdominal tenderness.  Musculoskeletal:     Lumbar back: Positive left straight leg raise test.     Comments: Tenderness to palpation over left lower lumbar musculature.  No midline spinal tenderness, step-offs  or crepitus.  5/5 strength in bilateral lower extremities, and sensation intact.  Skin:    General: Skin is warm and dry.  Neurological:     General: No focal deficit present.     Mental Status: He is alert.    ED Results / Procedures / Treatments   Labs (all labs ordered are listed, but only abnormal results are displayed) Labs Reviewed - No data to display  EKG None  Radiology No results found.  Procedures Procedures    Medications Ordered in ED Medications - No data to display  ED Course/ Medical Decision Making/ A&P                           Medical Decision Making Risk OTC drugs. Prescription drug management.  This patient is a 56 y.o. male who  presents to the ED for concern of back pain.   Differential diagnoses prior to evaluation: Fracture (acute/chronic), muscle strain, cauda equina, spinal stenosis, DDD, ligamentous injury, disk herniation, metastatic cancer, vertebral osteomyelitis, kidney stone, pyelonephritis, AAA, pancreatitis, bowel obstruction, meningitis.  Past Medical History / Co-morbidities / Social History: Unknown medical allergies  Additional history: Chart reviewed. Pertinent results include: Not thoroughly documented which medications gave him allergic reaction. Appears he has had tylenol multiple times without reaction as well as robaxin.   Physical Exam: Physical exam performed. The pertinent findings include: Patient is afebrile.  No midline spinal tenderness, step-offs or crepitus.  Left lower lumbar musculature tenderness to palpation.  Positive left straight leg raise.  Normal strength and sensation bilaterally.   Disposition: After consideration of the diagnostic results and the patients response to treatment, I feel that patient's not requiring admission or inpatient treatment for his symptoms.  I think his symptoms are likely related to lumbar strain versus sciatica.  He has not previously had imaging on his back performed.  I do not think this is necessary at this time as he had no trauma to the area, but I did advise him that he may require imaging going forward, especially if this treatment plan does not help.  Will prescribe Tylenol, muscle relaxer, and Medrol Dosepak.  Recommended following up with his primary care doctor.  Discussed reasons to return to the emergency department, and the patient is agreeable to the plan.   Final Clinical Impression(s) / ED Diagnoses Final diagnoses:  Acute left-sided low back pain with left-sided sciatica    Rx / DC Orders ED Discharge Orders          Ordered    acetaminophen (TYLENOL) 500 MG tablet  Every 6 hours PRN        10/15/21 2153    methocarbamol  (ROBAXIN) 500 MG tablet  2 times daily        10/15/21 2153    methylPREDNISolone (MEDROL DOSEPAK) 4 MG TBPK tablet        10/15/21 2153           Portions of this report may have been transcribed using voice recognition software. Every effort was made to ensure accuracy; however, inadvertent computerized transcription errors may be present.    Jeanella Flattery 10/15/21 2219    Terrilee Files, MD 10/16/21 1018

## 2021-10-15 NOTE — Discharge Instructions (Addendum)
You were seen in the emergency department for back pain.  I think your pain is likely related to arthritis in your spine and overuse of the muscles.  I'm writing you prescriptions for: an anti-inflammatory medicine, a muscle relaxer, and a steroid medicine.  If you have any worsening symptoms such as difficulties walking, difficulties using the bathroom, or numbness in your groin, go to [facility] ER immediately for an MRI.  ____________________________________________________________________________________________ Lo vieron en el departamento de emergencias por dolor de espalda.  Creo que su dolor probablemente est relacionado con la artritis en la columna y el uso excesivo de los msculos.  Le estoy escribiendo recetas para: un medicamento antiinflamatorio, un relajante muscular y un medicamento esteroide.  Si tiene algn sntoma que empeora, como dificultades para caminar, dificultades para usar el bao o entumecimiento en la ingle, vaya a la sala de emergencias de [place] de inmediato para una Health visitor.

## 2021-11-21 ENCOUNTER — Encounter (HOSPITAL_BASED_OUTPATIENT_CLINIC_OR_DEPARTMENT_OTHER): Payer: Self-pay | Admitting: Emergency Medicine

## 2021-11-21 ENCOUNTER — Emergency Department (HOSPITAL_BASED_OUTPATIENT_CLINIC_OR_DEPARTMENT_OTHER)
Admission: EM | Admit: 2021-11-21 | Discharge: 2021-11-21 | Disposition: A | Discharge status: Home / Self Care | Payer: BLUE CROSS/BLUE SHIELD | Attending: Emergency Medicine | Admitting: Emergency Medicine

## 2021-11-21 DIAGNOSIS — R21 Rash and other nonspecific skin eruption: Secondary | ICD-10-CM | POA: Diagnosis present

## 2021-11-21 DIAGNOSIS — L509 Urticaria, unspecified: Secondary | ICD-10-CM | POA: Insufficient documentation

## 2021-11-21 LAB — URINALYSIS, ROUTINE W REFLEX MICROSCOPIC
Bilirubin Urine: NEGATIVE
Glucose, UA: NEGATIVE mg/dL
Hgb urine dipstick: NEGATIVE
Ketones, ur: NEGATIVE mg/dL
Leukocytes,Ua: NEGATIVE
Nitrite: NEGATIVE
Protein, ur: NEGATIVE mg/dL
Specific Gravity, Urine: 1.02 (ref 1.005–1.030)
pH: 7.5 (ref 5.0–8.0)

## 2021-11-21 MED ORDER — DEXAMETHASONE SODIUM PHOSPHATE 10 MG/ML IJ SOLN
10.0000 mg | Freq: Once | INTRAMUSCULAR | Status: AC
  Administered 2021-11-21: 10 mg via INTRAMUSCULAR
  Filled 2021-11-21: qty 1

## 2021-11-21 MED ORDER — DIPHENHYDRAMINE HCL 25 MG PO CAPS
50.0000 mg | ORAL_CAPSULE | Freq: Once | ORAL | Status: AC
  Administered 2021-11-21: 50 mg via ORAL
  Filled 2021-11-21: qty 2

## 2021-11-21 MED ORDER — HYDROXYZINE HCL 25 MG PO TABS: 25.0000 mg | ORAL_TABLET | Freq: Four times a day (QID) | ORAL | 0 refills | Status: AC | PRN

## 2021-11-21 NOTE — ED Notes (Signed)
Patient verbalizes understanding of discharge instructions. Opportunity for questioning and answers were provided. Armband removed by staff, pt discharged from ED. Ambulated out to lobby  

## 2021-11-21 NOTE — ED Provider Notes (Signed)
[facility] Provider Note: Lowella Dell, MD, FACEP  CSN: [id number] MRN: [id number] ARRIVAL: 11/21/21 at 0034 ROOM: [facility]07/[facility]07   CHIEF COMPLAINT  Rash   HISTORY OF PRESENT ILLNESS  11/21/21 3:24 AM Bruce Weeks is a 56 y.o. 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  He is here with a pruritic urticarial rash for the past 3 days.  He does not know what triggered this.  He has been using Benadryl, last dose about 9 PM.  He states the Benadryl is not adequately treating his symptoms.  He denies any throat swelling, nausea, vomiting or diarrhea.  He is not wheezing.  He has also had some burning with urination.   Past Medical History:  Diagnosis Date   Allergy    Angio-edema    Urticaria     History reviewed. No pertinent surgical history.  Family History  Problem Relation Age of Onset   Diabetes Mother    Cancer Father    Hypertension Maternal Grandmother    Heart disease Maternal Grandmother     Social History   Tobacco Use   Smoking status: Never   Smokeless tobacco: Never  Vaping Use   Vaping Use: Never used  Substance Use Topics   Alcohol use: Yes    Comment: occassionally   Drug use: No    Prior to Admission medications   Medication Sig Start Date End Date Taking? Authorizing Provider  hydrOXYzine (ATARAX) 25 MG tablet Take 1-2 tablets (25-50 mg total) by mouth every 6 (six) hours as needed for itching (may cause drowsiness). 11/21/21  Yes Mahlani Berninger, MD  EPINEPHrine 0.3 mg/0.3 mL IJ SOAJ injection Inject 0.3 mLs (0.3 mg total) into the muscle once as needed for up to 1 dose. 09/23/17   Bobbitt, Heywood Iles, MD  methocarbamol (ROBAXIN) 500 MG tablet Take 1 tablet (500 mg total) by mouth 2 (two) times daily. 10/15/21   Roemhildt, Lorin T, PA-C    Allergies Patient has no known allergies.   REVIEW OF SYSTEMS  Negative except as noted here or in the History of Present Illness.   PHYSICAL EXAMINATION  Initial Vital Signs Blood pressure  (!) 146/86, pulse 64, temperature 97.7 F (36.5 C), temperature source Oral, resp. rate 14, height 5\' 6"  (1.676 m), weight 88.9 kg, SpO2 98 %.  Examination General: Well-developed, well-nourished male in no acute distress; appearance consistent with age of record HENT: normocephalic; atraumatic; pharynx normal Eyes: Normal appearance Neck: supple Heart: regular rate and rhythm Lungs: clear to auscultation bilaterally Abdomen: soft; nondistended; nontender; bowel sounds present Extremities: No deformity; full range of motion Neurologic: Awake, alert and oriented; motor function intact in all extremities and symmetric; no facial droop Skin: Warm and dry; generalized urticarial rash Psychiatric: Normal mood and affect   RESULTS  Summary of this visit's results, reviewed and interpreted by myself:   EKG Interpretation  Date/Time:    Ventricular Rate:    PR Interval:    QRS Duration:   QT Interval:    QTC Calculation:   R Axis:     Text Interpretation:         Laboratory Studies: Results for orders placed or performed during the hospital encounter of 11/21/21 (from the past 24 hour(s))  Urinalysis, Routine w reflex microscopic Urine, Clean Catch     Status: None   Collection Time: 11/21/21 12:56 AM  Result Value Ref Range   Color, Urine YELLOW YELLOW   APPearance CLEAR CLEAR   Specific Gravity, Urine 1.020  1.005 - 1.030   pH 7.5 5.0 - 8.0   Glucose, UA NEGATIVE NEGATIVE mg/dL   Hgb urine dipstick NEGATIVE NEGATIVE   Bilirubin Urine NEGATIVE NEGATIVE   Ketones, ur NEGATIVE NEGATIVE mg/dL   Protein, ur NEGATIVE NEGATIVE mg/dL   Nitrite NEGATIVE NEGATIVE   Leukocytes,Ua NEGATIVE NEGATIVE   Imaging Studies: No results found.  ED COURSE and MDM  Nursing notes, initial and subsequent vitals signs, including pulse oximetry, reviewed and interpreted by myself.  Vitals:   11/21/21 0044 11/21/21 0045  BP: (!) 146/86   Pulse: 64   Resp: 14   Temp: 97.7 F (36.5 C)    TempSrc: Oral   SpO2: 98%   Weight:  88.9 kg  Height:  5\' 6"  (1.676 m)   Medications  dexamethasone (DECADRON) injection 10 mg (has no administration in time range)  diphenhydrAMINE (BENADRYL) capsule 50 mg (has no administration in time range)   Patient given an injection of dexamethasone and we will switch him to hydroxyzine as this may work better than the Benadryl.  He was warned that hydroxyzine may cause drowsiness.  His urinalysis is not consistent with a urinary tract infection.  It has been sent for [facility] and Chlamydia testing.   PROCEDURES  Procedures   ED DIAGNOSES     ICD-10-CM   1. Urticaria  L50.9          Trenise Turay, MD 11/21/21 [phone]

## 2021-11-21 NOTE — ED Triage Notes (Addendum)
Pt states he has had hives since Saturday   Unknown cause  Pt has been using benadryl at home  Last dose around 9pm  No difficulty breathing or swallowing at this time  Pt also adds that it burns when he urinates and that started today

## 2021-11-22 LAB — GC/CHLAMYDIA PROBE AMP (~~LOC~~) NOT AT ARMC
Chlamydia: NEGATIVE
Comment: NEGATIVE
Comment: NORMAL
Neisseria Gonorrhea: NEGATIVE
